# Patient Record
Sex: Male | Born: 1940
Health system: Southern US, Community
[De-identification: ages and names within clinical notes are randomized; demographics above are authoritative.]

## PROBLEM LIST (undated history)

## (undated) DIAGNOSIS — I10 Essential (primary) hypertension: Secondary | ICD-10-CM

## (undated) DIAGNOSIS — K635 Polyp of colon: Secondary | ICD-10-CM

## (undated) DIAGNOSIS — C61 Malignant neoplasm of prostate: Secondary | ICD-10-CM

## (undated) DIAGNOSIS — E785 Hyperlipidemia, unspecified: Secondary | ICD-10-CM

## (undated) DIAGNOSIS — K5790 Diverticulosis of intestine, part unspecified, without perforation or abscess without bleeding: Secondary | ICD-10-CM

## (undated) HISTORY — DX: Hyperlipidemia, unspecified: E78.5

## (undated) HISTORY — DX: Diverticulosis of intestine, part unspecified, without perforation or abscess without bleeding: K57.90

## (undated) HISTORY — DX: Essential (primary) hypertension: I10

## (undated) HISTORY — DX: Malignant neoplasm of prostate: C61

## (undated) HISTORY — PX: PROSTATECTOMY: SHX69

---

## 1999-05-11 ENCOUNTER — Inpatient Hospital Stay (HOSPITAL_COMMUNITY): Admission: RE | Admit: 1999-05-11 | Discharge: 1999-05-17 | Payer: Self-pay | Admitting: Psychiatry

## 1999-05-13 ENCOUNTER — Encounter (HOSPITAL_COMMUNITY): Payer: Self-pay | Admitting: Psychiatry

## 1999-05-20 ENCOUNTER — Encounter (HOSPITAL_COMMUNITY): Admission: RE | Admit: 1999-05-20 | Discharge: 1999-05-27 | Payer: Self-pay | Admitting: Psychiatry

## 2001-05-14 ENCOUNTER — Other Ambulatory Visit: Admission: RE | Admit: 2001-05-14 | Discharge: 2001-05-14 | Payer: Self-pay | Admitting: Urology

## 2002-09-06 ENCOUNTER — Ambulatory Visit (HOSPITAL_COMMUNITY): Admission: RE | Admit: 2002-09-06 | Discharge: 2002-09-06 | Payer: Self-pay | Admitting: Internal Medicine

## 2002-09-06 ENCOUNTER — Encounter: Payer: Self-pay | Admitting: Internal Medicine

## 2003-02-25 ENCOUNTER — Other Ambulatory Visit: Admission: RE | Admit: 2003-02-25 | Discharge: 2003-02-25 | Payer: Self-pay | Admitting: Urology

## 2003-12-02 ENCOUNTER — Ambulatory Visit (HOSPITAL_COMMUNITY): Admission: RE | Admit: 2003-12-02 | Discharge: 2003-12-02 | Payer: Self-pay | Admitting: Internal Medicine

## 2003-12-02 HISTORY — PX: COLONOSCOPY: SHX174

## 2007-03-08 ENCOUNTER — Encounter (INDEPENDENT_AMBULATORY_CARE_PROVIDER_SITE_OTHER): Payer: Self-pay | Admitting: *Deleted

## 2007-03-08 ENCOUNTER — Ambulatory Visit (HOSPITAL_COMMUNITY): Admission: RE | Admit: 2007-03-08 | Discharge: 2007-03-08 | Payer: Self-pay | Admitting: Internal Medicine

## 2007-03-08 ENCOUNTER — Ambulatory Visit: Payer: Self-pay | Admitting: Internal Medicine

## 2007-03-08 HISTORY — PX: COLONOSCOPY: SHX174

## 2008-12-05 DIAGNOSIS — C61 Malignant neoplasm of prostate: Secondary | ICD-10-CM

## 2008-12-05 HISTORY — DX: Malignant neoplasm of prostate: C61

## 2010-08-24 ENCOUNTER — Encounter (HOSPITAL_COMMUNITY): Admission: RE | Admit: 2010-08-24 | Discharge: 2010-08-24 | Payer: Self-pay | Admitting: Urology

## 2011-03-13 ENCOUNTER — Emergency Department (HOSPITAL_COMMUNITY)
Admission: EM | Admit: 2011-03-13 | Discharge: 2011-03-14 | Disposition: A | Discharge status: Home / Self Care | Payer: Medicare Other | Attending: Emergency Medicine | Admitting: Emergency Medicine

## 2011-03-13 DIAGNOSIS — Z79899 Other long term (current) drug therapy: Secondary | ICD-10-CM | POA: Insufficient documentation

## 2011-03-13 DIAGNOSIS — IMO0002 Reserved for concepts with insufficient information to code with codable children: Secondary | ICD-10-CM | POA: Insufficient documentation

## 2011-03-13 DIAGNOSIS — R4182 Altered mental status, unspecified: Secondary | ICD-10-CM | POA: Insufficient documentation

## 2011-03-13 DIAGNOSIS — I10 Essential (primary) hypertension: Secondary | ICD-10-CM | POA: Insufficient documentation

## 2011-03-13 DIAGNOSIS — F29 Unspecified psychosis not due to a substance or known physiological condition: Secondary | ICD-10-CM | POA: Insufficient documentation

## 2011-03-13 DIAGNOSIS — R000 Tachycardia, unspecified: Secondary | ICD-10-CM | POA: Insufficient documentation

## 2011-03-13 DIAGNOSIS — Z7982 Long term (current) use of aspirin: Secondary | ICD-10-CM | POA: Insufficient documentation

## 2011-03-13 LAB — DIFFERENTIAL
Basophils Absolute: 0 10*3/uL (ref 0.0–0.1)
Basophils Relative: 0 % (ref 0–1)
Eosinophils Absolute: 0 10*3/uL (ref 0.0–0.7)
Eosinophils Relative: 0 % (ref 0–5)
Lymphocytes Relative: 8 % — ABNORMAL LOW (ref 12–46)
Lymphs Abs: 0.7 10*3/uL (ref 0.7–4.0)
Monocytes Absolute: 0.5 10*3/uL (ref 0.1–1.0)
Monocytes Relative: 6 % (ref 3–12)
Neutro Abs: 6.9 10*3/uL (ref 1.7–7.7)
Neutrophils Relative %: 85 % — ABNORMAL HIGH (ref 43–77)

## 2011-03-13 LAB — BASIC METABOLIC PANEL
CO2: 18 mEq/L — ABNORMAL LOW (ref 19–32)
Calcium: 8.7 mg/dL (ref 8.4–10.5)
Chloride: 99 mEq/L (ref 96–112)
GFR calc Af Amer: >60 mL/min (ref >60)
Glucose, Bld: 186 mg/dL — ABNORMAL HIGH (ref 70–99)
Sodium: 135 mEq/L (ref 135–145)

## 2011-03-13 LAB — CBC
HCT: 42.6 % (ref 39.0–52.0)
Hemoglobin: 14.5 g/dL (ref 13.0–17.0)
MCH: 31 pg (ref 26.0–34.0)
MCHC: 34 g/dL (ref 30.0–36.0)
MCV: 91.2 fL (ref 78.0–100.0)
Platelets: 251 10*3/uL (ref 150–400)
RBC: 4.67 MIL/uL (ref 4.22–5.81)
RDW: 13.1 % (ref 11.5–15.5)
WBC: 8.1 10*3/uL (ref 4.0–10.5)

## 2011-03-13 LAB — RAPID URINE DRUG SCREEN, HOSP PERFORMED
Amphetamines: NOT DETECTED
Barbiturates: NOT DETECTED
Benzodiazepines: NOT DETECTED
Cocaine: NOT DETECTED
Opiates: NOT DETECTED
Tetrahydrocannabinol: NOT DETECTED

## 2011-03-14 ENCOUNTER — Emergency Department (HOSPITAL_COMMUNITY): Payer: Medicare Other

## 2011-03-16 ENCOUNTER — Inpatient Hospital Stay (HOSPITAL_COMMUNITY)
Admission: EM | Admit: 2011-03-16 | Discharge: 2011-03-19 | DRG: 885 | Disposition: A | Discharge status: Other Acute Inpatient Hospital | Payer: Medicare Other | Attending: Internal Medicine | Admitting: Internal Medicine

## 2011-03-16 ENCOUNTER — Emergency Department (HOSPITAL_COMMUNITY): Payer: Medicare Other

## 2011-03-16 DIAGNOSIS — F29 Unspecified psychosis not due to a substance or known physiological condition: Principal | ICD-10-CM | POA: Diagnosis present

## 2011-03-16 DIAGNOSIS — R7301 Impaired fasting glucose: Secondary | ICD-10-CM | POA: Diagnosis present

## 2011-03-16 DIAGNOSIS — E785 Hyperlipidemia, unspecified: Secondary | ICD-10-CM | POA: Diagnosis present

## 2011-03-16 DIAGNOSIS — F1010 Alcohol abuse, uncomplicated: Secondary | ICD-10-CM | POA: Diagnosis present

## 2011-03-16 LAB — URINALYSIS, ROUTINE W REFLEX MICROSCOPIC
Bilirubin Urine: NEGATIVE
Glucose, UA: NEGATIVE mg/dL
Hgb urine dipstick: NEGATIVE
Protein, ur: NEGATIVE mg/dL

## 2011-03-16 LAB — DIFFERENTIAL
Eosinophils Relative: 1 % (ref 0–5)
Lymphocytes Relative: 22 % (ref 12–46)
Lymphs Abs: 1 10*3/uL (ref 0.7–4.0)
Monocytes Absolute: 0.5 10*3/uL (ref 0.1–1.0)
Monocytes Relative: 10 % (ref 3–12)
Neutro Abs: 3.2 10*3/uL (ref 1.7–7.7)

## 2011-03-16 LAB — RAPID URINE DRUG SCREEN, HOSP PERFORMED
Amphetamines: NOT DETECTED
Barbiturates: NOT DETECTED
Benzodiazepines: NOT DETECTED
Tetrahydrocannabinol: NOT DETECTED

## 2011-03-16 LAB — BASIC METABOLIC PANEL
BUN: 7 mg/dL (ref 6–23)
CO2: 26 mEq/L (ref 19–32)
Calcium: 9.1 mg/dL (ref 8.4–10.5)
Chloride: 102 mEq/L (ref 96–112)
Creatinine, Ser: 0.94 mg/dL (ref 0.4–1.5)
GFR calc Af Amer: >60 mL/min (ref >60)
Glucose, Bld: 115 mg/dL — ABNORMAL HIGH (ref 70–99)

## 2011-03-16 LAB — CBC
HCT: 41.9 % (ref 39.0–52.0)
Hemoglobin: 14.2 g/dL (ref 13.0–17.0)
MCH: 31.1 pg (ref 26.0–34.0)
MCHC: 33.9 g/dL (ref 30.0–36.0)
MCV: 91.9 fL (ref 78.0–100.0)
RDW: 12.8 % (ref 11.5–15.5)

## 2011-03-16 LAB — TSH: TSH: 1.186 u[IU]/mL (ref 0.350–4.500)

## 2011-03-18 ENCOUNTER — Inpatient Hospital Stay (HOSPITAL_COMMUNITY): Payer: Medicare Other

## 2011-03-18 LAB — HEPATIC FUNCTION PANEL
ALT: 20 U/L (ref 0–53)
AST: 21 U/L (ref 0–37)
Bilirubin, Direct: 0.1 mg/dL (ref 0.0–0.3)
Indirect Bilirubin: 0.5 mg/dL (ref 0.3–0.9)
Total Bilirubin: 0.6 mg/dL (ref 0.3–1.2)

## 2011-03-19 ENCOUNTER — Inpatient Hospital Stay (HOSPITAL_COMMUNITY)
Admission: RE | Admit: 2011-03-19 | Discharge: 2011-03-22 | DRG: 885 | Disposition: A | Discharge status: Home / Self Care | Payer: 59 | Source: Ambulatory Visit | Attending: Psychiatry | Admitting: Psychiatry

## 2011-03-19 DIAGNOSIS — F4321 Adjustment disorder with depressed mood: Secondary | ICD-10-CM

## 2011-03-19 DIAGNOSIS — Z7982 Long term (current) use of aspirin: Secondary | ICD-10-CM

## 2011-03-19 DIAGNOSIS — E781 Pure hyperglyceridemia: Secondary | ICD-10-CM

## 2011-03-19 DIAGNOSIS — F0390 Unspecified dementia without behavioral disturbance: Secondary | ICD-10-CM

## 2011-03-19 DIAGNOSIS — E876 Hypokalemia: Secondary | ICD-10-CM

## 2011-03-19 DIAGNOSIS — F1020 Alcohol dependence, uncomplicated: Secondary | ICD-10-CM

## 2011-03-19 DIAGNOSIS — F29 Unspecified psychosis not due to a substance or known physiological condition: Principal | ICD-10-CM

## 2011-03-19 DIAGNOSIS — R7309 Other abnormal glucose: Secondary | ICD-10-CM

## 2011-03-19 LAB — BASIC METABOLIC PANEL
BUN: 9 mg/dL (ref 6–23)
Calcium: 9.2 mg/dL (ref 8.4–10.5)
GFR calc non Af Amer: >60 mL/min (ref >60)
Glucose, Bld: 136 mg/dL — ABNORMAL HIGH (ref 70–99)

## 2011-03-20 DIAGNOSIS — F1020 Alcohol dependence, uncomplicated: Secondary | ICD-10-CM

## 2011-03-20 LAB — GLUCOSE, CAPILLARY: Glucose-Capillary: 105 mg/dL — ABNORMAL HIGH (ref 70–99)

## 2011-03-21 LAB — HEMOGLOBIN A1C
Hgb A1c MFr Bld: 5.4 % (ref <5.7)
Mean Plasma Glucose: 108 mg/dL (ref <117)

## 2011-03-21 LAB — GLUCOSE, CAPILLARY: Glucose-Capillary: 99 mg/dL (ref 70–99)

## 2011-03-21 NOTE — Group Therapy Note (Addendum)
  NAMEDENY, CHEVEZ              ACCOUNT NO.:  [id number]  MEDICAL RECORD NO.:  [id number]         PATIENT TYPE:  INP  LOCATION:                                FACILITY:  [facility]  PHYSICIAN:  Kingsley Callander. Ouida Sills, MD       DATE OF BIRTH:  12-21-1940  DATE OF PROCEDURE: DATE OF DISCHARGE:                                PROGRESS NOTE   HISTORY OF PRESENT ILLNESS:  Mr. Luz Brazen was admitted yesterday after exhibiting psychotic behavior again.  He had been in the hospital emergency room a few days ago and he has been evaluated but evidently discharged home instead of being sent to a psychiatric facility.  He has a history of alcohol abuse and had 6-8 beers during the night prior to being admitted midday yesterday.  His alcohol level yesterday was less than 5 however.  He underwent a CT scan of the brain which revealed which revealed no acute abnormalities yesterday afternoon and later underwent an MRI of the brain which revealed no acute abnormalities.  He did have nonspecific white matter changes.  He had also undergone a CT scan of the brain back on the March 14, 2011, which revealed no acute abnormalities.  He has exhibited paranoid spells.  He has heard voices. He has been afraid that the scalp was falling.  He has been afraid that the world has been attacked and that the [place] is the only placed left.  He has had sleep disturbance with staying up at night.  He is alert and is fully oriented this morning.  He shows no tremulousness or signs of alcohol withdrawal.  PHYSICAL EXAMINATION:  VITAL SIGNS:  Temperature 97.3, pulse 72, respirations 18, blood pressure 111/75, oxygen saturation 97% on room air. HEENT:  Unremarkable. LUNGS:  Clear. HEART:  Regular with no murmurs. ABDOMEN:  Nontender with no hepatosplenomegaly. EXTREMITIES:  No edema. NEURO:  No focal weakness.  No tremulousness or other acute abnormalities.  IMPRESSION/PLAN:  Psychosis.  Telepsychiatry consultation will  be obtained.  He is evidently been seen by the ACT team, but I see no documentation of that in the progress note and I am unsure of exactly what happened with their assessment during his recent emergency room stay.  He is euthyroid with a TSH of 1.18.  He has an ammonia level of 24.  His urine drug screen reveals no drugs.  His wife denies any drug use.  His urinalysis shows no sign of infection, although he did have 15 mg/dL of ketones.  He is mildly hypokalemic at 3.3 and mildly hyperglycemic at 115.  His blood counts reveal a hemoglobin of 14.2, white count 4.8, and platelets of 198,000.  Chest x-ray reveals minimal bronchitic changes and bibasilar atelectasis.  He will be started empirically on Risperdal 0.25 mg b.i.d.  He will be treated with an alcohol withdrawal protocol.     Kingsley Callander. Ouida Sills, MD     ROF/MEDQ  D:  03/17/2011  T:  03/17/2011  Job:  [id number]  Electronically Signed by Carylon Perches MD on 04/02/2011 11:09:11 AM

## 2011-03-21 NOTE — H&P (Signed)
NAMEPHI, AVANS              ACCOUNT NO.:  [id number]  MEDICAL RECORD NO.:  [id number]           PATIENT TYPE:  I  LOCATION:  A341                          FACILITY:  [facility]  PHYSICIAN:  Rosalita Carey L. Juanetta Gosling, M.D.DATE OF BIRTH:  May 06, 1941  DATE OF ADMISSION:  03/16/2011 DATE OF DISCHARGE:  LH                             HISTORY & PHYSICAL   REASON FOR ADMISSION:  Confusion.  HISTORY:  Mr. Balestrieri is a 70 year old who has been confused now for at least 4-5 days.  His wife says that he has had problems with confusion. He has been having some episodes of delusional behavior.  He has been agitated.  He has not been sleeping and he was seen here on the 8th. Apparently at that time, he was proclaiming himself to be God and that the devil was after him.  He had a psych and medical evaluation, was not felt to be in a situation that he needed commitment and was discharged. He has apparently been doing better, but as soon as he got home, he says he has been hearing voices, he has been confused, disoriented.  He is not sleeping and apparently he has put out areas in the home where only he can walk and other areas where only his wife can walk.  He is coherent now, but tangential.  His past medical history was otherwise positive for previous episode of psychosis that culminated in him being admitted to a psychiatric institution.  PAST MEDICAL HISTORY:  Positive for hypertension and hyperlipidemia.  He has had a prostate resection.  SOCIAL HISTORY:  He does not smoke, does not use any illicit drugs, does use alcohol, but his alcohol level was low when he came in.  His only medications are Niaspan and aspirin.  His family history is not positive for any sort of psychosis.  REVIEW OF SYSTEMS:  Except as mentioned negative.  PHYSICAL EXAMINATION:  GENERAL:  He is really not willing to answer those questions. HEENT:  His pupils are reactive.  Nose and throat are clear. NECK:  Supple  without masses. CHEST:  Fairly clear. HEART:  Regular. ABDOMEN:  Soft. EXTREMITIES:  Showed no edema. CENTRAL NERVOUS SYSTEM EXAMINATION:  Shows that he is somewhat agitated.  Laboratory work shows his white count is 4800, hemoglobin is 14.2, platelets 198, potassium is 3.3, glucose of 115, BUN is 7, creatinine 0.94.  Urinalysis is essentially negative.  He has some ketones in his urine suggestive that he has not been eating.  His alcohol level less than 5, it was also less than 5 on the 8th.  Drug screen on the 8th was normal.  Drug screen now is normal.  Assessment then he has clearly had some psychosis.  It was thought in the emergency room that this may be more of delirium and with his history of significant alcohol abuse, I am going to treat him as if he has delirium tremens, but I think it is probably mental illness based on his previous history.  I am going to put him on some Ativan now and put him on alcohol withdrawal protocol just to be  sure it is not clear whether he has been drinking or not.     Annaleigha Woo L. Juanetta Gosling, M.D.     ELH/MEDQ  D:  03/16/2011  T:  03/17/2011  Job:  [id number]  Electronically Signed by Kari Baars M.D. on 03/21/2011 08:26:58 AM

## 2011-03-25 NOTE — H&P (Signed)
NAMEGIULIAN, Howard Sawyer              ACCOUNT NO.:  [id number]  MEDICAL RECORD NO.:  [id number]           PATIENT TYPE:  I  LOCATION:  0304                          [facility]  [facility]  PHYSICIAN:  Anselm Jungling, MD  DATE OF BIRTH:  04-15-1941  DATE OF ADMISSION:  03/19/2011 DATE OF DISCHARGE:                      PSYCHIATRIC ADMISSION ASSESSMENT   He is a 70 year old married white male who was admitted for symptoms of psychosis from [facility].  The patient was brought in on 2 different occasions in the week prior to the emergency room where he was displaying psychotic symptoms and bizarre behavior, thinking that he was God and hearing voices.  His wife had reported that he had cordoned off several portions of the house, saying that he could not speak with her while he was in these areas and that he could not speak directly to her. The patient was treated and released when symptomatology decreased and his mentation returned to normal from the emergency room.  He presented again 48 hours later with the same symptoms and was treated with rehydration, Librium detox and potassium.  He was also given a small amount of Risperdal.  He presents today for further evaluation.  PAST PSYCHIATRIC HISTORY:  He has a history of psychiatric admission in the past where he has had some psychosis but the location is unclear. He also reports being detoxed at some point in the 82s and in 2000.  FAMILY HISTORY:  He is 1 of 4 children.  He was born in [place].  Both of his parents are deceased.  Father was a Forensic scientist.  He is #3 out of 4, and his older brother passed away.  He has no natural children and 2 stepchildren.  He finished high school and had some college.  She has a history of Financial planner in [employer] for 6 years and 1 year in the [employer].  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  PSYCHIATRIC FAMILY HISTORY:  None.  PAST MEDICAL HISTORY:  The patient has had prostate surgery in October 2011.  He has a history of hypertriglyceridemia and sees [facility]/support/administrative officer in [place].  He takes niacin and an aspirin daily.  He is a nonsmoker and reportedly drinks 6 to 8 beers daily and has done that for the past 30 years.  He has been sober for as long as a year in the past but today cannot tell me when he was sober last.  He also states that he may have been drinking more beer lately due to the grief over the recent loss of his brother.  HE HAS NO KNOWN DRUG ALLERGIES.  Vital signs are stable.  The patient was evaluated Shaune Pollack in the emergency room, and the exam was unremarkable.  PERTINENT LABORATORY:  CBC which was normal.  Drug screen which was negative.  Alcohol level on the 8th was less than 5.  Alcohol level on the 12th was less than 5.  Basic metabolic profile on the 8th showed decreased potassium  at 3.1, elevated glucose 186 and a GFR of 51. Repeat BMET again on the 11th showed a potassium of 3.3, glucose 115, ammonia was 24, TSH also 1.186.  Lipase was 50, SGOT 21, SGPT 20. Hepatitis panel is grossly negative.  MENTAL STATUS EXAM:  The patient is alert, oriented and cooperative.  He is well-oriented to day, date and location.  He is dressed neatly, hair is combed.  He was is cooperative, makes good eye contact.  His speech is clear, goal-directed and coherent.  His mood is calm.  He is cooperative, appears somewhat depressed.  He does tend to have a fixed gaze when he is thinking.  His affect is appropriate.  Thought process is linear today.  He denies auditory or visual hallucinations; however, endorses having what he thought was bad dreams several days ago but has not had any since he has been in the hospital.  Cognition is at least average.  Memory is appropriate for short and long-term, and his abstract thinking is well.  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 AXIS II:  Negative. AXIS III:  Hypertriglyceridemia. AXIS IV:  Grief. AXIS V:  Current 40.  PLAN:  We will continue Risperdal 0.5 b.i.d.  Continue to monitor for signs of alcohol withdrawal.  Careful watchful waiting.  Estimated length of stay 3-5 days.    ______________________________ Verne Spurr, PA   ______________________________ Anselm Jungling, MD    NM/MEDQ  D:  03/20/2011  T:  03/20/2011  Job:  [id number]  Electronically Signed by Verne Spurr  on 03/21/2011 04:06:32 PM Electronically Signed by Geralyn Flash MD on 03/25/2011 11:43:32 AM

## 2011-03-30 NOTE — Discharge Summary (Signed)
NAMETAIKI, Howard Sawyer              ACCOUNT NO.:  [id number]  MEDICAL RECORD NO.:  [id number]           PATIENT TYPE:  I  LOCATION:  0304                          [facility]  PHYSICIAN:  Eulogio Ditch, MD DATE OF BIRTH:  1941-01-29  DATE OF ADMISSION:  03/19/2011 DATE OF DISCHARGE:                              DISCHARGE SUMMARY   HISTORY OF PRESENT ILLNESS:  The patient is a 70 year old, married white male who was admitted for symptoms of psychosis from [facility].  The patient had been brought to [facility] where he had been displaying psychotic symptoms, bizarre behavior, hearing voices.  The patient was treated initially for alcohol delirium with detox and then transferred to [facility].  For past psychiatric history, family history, social history and medical history please see previously dictated admission note.  PERTINENT LABORATORY DATA:  The patient was minimally hypokalemic and treated with potassium during his stay.  He had consistently been hyperglycemic throughout his hospitalization, and hemoglobin A1c was checked with a final result of 5.4.  CBGs were discontinued.  Other pertinent laboratory data included a negative hepatitis panel and a normal basic metabolic profile with the exception of hyperglycemia. Normal hepatic functions were done on the 13th of April, which revealed a minimally low albumin at 3.4.  All else was stable.  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 Axis II: Deferred. Axis III: Increased triglycerides as well as increased glucose. Axis IV: Grief related to the recent loss of his brother with associated depression. Axis V: At discharge the patient has a Global Assessment of Functioning of 58.  COURSE OF THE HOSPITAL STAY:  Unremarkable.  The patient participated in all groups and responded well to intervention from the family regarding his use of alcohol  and a need for follow-up treatment afterwards.  The patient responded well to pet therapy as he is a dog lover.  The patient also discussed and psychoeducation done regarding the need for medications and the need for follow-up care after discharge from [facility] with private psychiatrist as well as outpatient supportive treatment for alcohol abuse.  MENTAL STATUS EXAM:  The patient is alert, oriented and cooperative.  He has positive mood and a bright affect.  He voices no suicidal or homicidal ideation.  Reports that his depression is better.  His cognition is good without any indication of auditory or visual hallucinations.  He has not those since starting on Risperdal, which he has responded to quite well without any side effects, any evidence of movement disorder, and says that he feels much better on his.  Thinking is linear and straightforward and affect is appropriate.  No suicidal or homicidal ideation.  Perception is intact.  He is in full touch with reality and no evidence of psychosis.  Memory is as improved significantly.  Language is clear, straightforward, normal rate and rhythm, goal-directed coherent.  Insight is good in relationship to his need for treatment and follow-up care in, and judgment is good in that he states he plans to follow up appropriately.  MEDICATIONS AT DISCHARGE:  Risperdal 0.5 mg 1 tablet b.i.d.  He will continue on enteric-coated aspirin, fish oil and Niaspan SR 500 one p.o. daily.  DISCHARGE FOLLOW UP:  He will follow up with his psychiatrist, Dr. Katherine Roan, in the outpatient clinic and the appointment is scheduled for May 10th at 11:00 a.m.  He is given the name, date, number and address to follow up.  He will also be advised to participate in local AA meetings and that information is given as well.  The patient stated he was thankful for his time here and feels much better and will be pleased to follow up as  planned.    ______________________________ Verne Spurr, PA   ______________________________ Eulogio Ditch, MD    NM/MEDQ  D:  03/22/2011  T:  03/22/2011  Job:  [id number]  Electronically Signed by Verne Spurr  on 03/28/2011 02:04:30 PM Electronically Signed by Eulogio Ditch  on 03/30/2011 05:03:49 PM

## 2011-04-02 NOTE — Discharge Summary (Signed)
  NAMESTEVE, YOUNGBERG              ACCOUNT NO.:  [id number]  MEDICAL RECORD NO.:  [id number]          PATIENT TYPE:  LOCATION:                                 FACILITY:  PHYSICIAN:  Kingsley Callander. Ouida Sills, MD            DATE OF BIRTH:  DATE OF ADMISSION: DATE OF DISCHARGE:  LH                         DISCHARGE SUMMARY-REFERRING   ADDENDUM: His other discharge medication is Risperdal 0.25 mg b.i.d., which was started on the March 17, 2011.     Kingsley Callander. Ouida Sills, MD     ROF/MEDQ  D:  03/18/2011  T:  03/18/2011  Job:  [id number]  Electronically Signed by Carylon Perches MD on 04/02/2011 11:09:05 AM

## 2011-04-02 NOTE — Group Therapy Note (Signed)
  NAMEKAYEN, GRABEL              ACCOUNT NO.:  [id number]  MEDICAL RECORD NO.:  [id number]          PATIENT TYPE:  LOCATION:                                 FACILITY:  PHYSICIAN:  Kingsley Callander. Ouida Sills, MD            DATE OF BIRTH:  DATE OF PROCEDURE: DATE OF DISCHARGE:                                PROGRESS NOTE   Mr. Luz Brazen continues to exhibit delusional thinking.  He had an ACT team consult yesterday and a tele site consult.  It appears with a mid level at the psychiatric facility who recommended Librium and outpatient day-mark referral.  It is clearly documented in the medical record by the ACT team that the patient is delusional, paranoid, and exhibiting psychotic features.  PHYSICAL EXAMINATION:  VITAL SIGNS:  The patient's vital signs are normal.  His temperature is 97.6, pulse 66, respirations 19, blood pressure 114/76, and oxygen saturation 96%. GENERAL:  He shows no signs of active alcohol withdrawal.  He is calm. HEENT:  Unremarkable. LUNGS:  Clear. HEART:  Regular with no murmurs. ABDOMEN:  Nontender with no hepatosplenomegaly. EXTREMITIES:  No edema. NEURO:  No focal weakness.  He is not tremulous.  IMPRESSION/PLAN: 1. Psychosis:  This does not appear to be related to alcohol     withdrawal.  I am unclear why the mid level would change from an     alcohol withdrawal protocol using Ativan to Librium.  This would     not seem to make much difference in my mind.  Another attempt will     be made to obtain a tele site consult with a psychiatrist at this     time.  He has been started on Risperdal. 2. History of alcohol abuse.  It is my opinion that this patient needs inpatient psychiatric care. His wife has continued to observe bizarre behavior which has been previously documented.     Kingsley Callander. Ouida Sills, MD     ROF/MEDQ  D:  03/18/2011  T:  03/18/2011  Job:  [id number]  Electronically Signed by Carylon Perches MD on 04/02/2011 11:09:08 AM

## 2011-04-02 NOTE — Discharge Summary (Signed)
  Howard Sawyer, Howard Sawyer              ACCOUNT NO.:  [id number]  MEDICAL RECORD NO.:  [id number]          PATIENT TYPE:  LOCATION:                                 FACILITY:  PHYSICIAN:  Kingsley Callander. Ouida Sills, MD            DATE OF BIRTH:  DATE OF ADMISSION:  03/16/2011 DATE OF DISCHARGE:  04/13/2012LH                         DISCHARGE SUMMARY-REFERRING   DISCHARGE DIAGNOSES: 1. Acute psychosis. 2. Chronic alcohol abuse. 3. Impaired fasting glucose. 4. Hyperlipidemia. 5. Prostate cancer. 6. History of colon polyps.  PROCEDURES:  CT scan of the brain MRI of the brain.  HOSPITAL COURSE:  This patient is a 70 year old male, who was admitted on Wednesday after exhibiting bizarre behavior at home.  He was suffering delusional thinking and complaining of hearing voices.  He had paranoid ideation.  He had been in the emergency room on March 15, 2011, and had been discharged.  He had been treated at that time with Geodon. He has had CT scan of his brain and MRI, which revealed no acute abnormality.  He had been evaluated by the ACT team.  Upon representation, he was again evaluated by the ACT team.  He was hospitalized and was treated with an alcohol detox regimen including Ativan, thiamine, multivitamins, and folate.  He underwent a Telepsychiatry consult and the recommendation was to change him to Librium.  He is felt to require inpatient evaluation and treatment.  This was not satisfactorily being obtained through the [facility] and other arrangements with [facility] are currently in process.  He is not felt to be suffering from mere alcohol withdrawal.  He is not shaky.  He is calm, but clearly has disorder thinking.  He keeps telling me that he thinks this business in the hospital is a "[place]  He had previously been complaining of the devil chasing him.  He had been worried that the [place] was the only country left unscathed by  worldwide destruction.  His wife had clearly  not been able to deal with him in his current state.  He is certainly not an appropriate level to be discharged for outpatient alcohol follow up as had been recommended by the [facility] staff earlier.  As stated, arrangements are being made with [place].  CURRENT MEDICATIONS: 1. Librium taper, currently at 25 mg q.8 h. 2. Thiamine 100 mg daily. 3. Multivitamin one daily. 4. Folic acid 1 mg daily. 5. Aspirin 81 mg daily.  His previous outpatient medications had only included niacin 1000 mg daily and 3 fish oil capsules daily with aspirin 81 mg daily.     Kingsley Callander. Ouida Sills, MD     ROF/MEDQ  D:  03/18/2011  T:  03/18/2011  Job:  [id number]  Electronically Signed by Carylon Perches MD on 04/02/2011 11:09:01 AM

## 2011-04-14 ENCOUNTER — Ambulatory Visit (INDEPENDENT_AMBULATORY_CARE_PROVIDER_SITE_OTHER): Payer: Medicare Other | Admitting: Psychiatry

## 2011-04-14 DIAGNOSIS — F3189 Other bipolar disorder: Secondary | ICD-10-CM

## 2011-04-15 NOTE — Consult Note (Signed)
Howard Sawyer, DELLAROCCO              ACCOUNT NO.:  [id number]  MEDICAL RECORD NO.:  [id number]           PATIENT TYPE:  LOCATION:                                 FACILITY:  PHYSICIAN:  Verne Spurr, PA      DATE OF BIRTH:  02/16/1941  DATE OF CONSULTATION: DATE OF DISCHARGE:                                CONSULTATION   REASON FOR CONSULTATION:  The patient was requested to have a psychiatric evaluation after his second visit to the [facility] Room for mental status changes.  This consult is requested by Dr. Kari Baars in [place], [place].  Mr. Lundberg and his wife were evaluated via telemedicine conference on January 16, 2011, at 4:15 p.m. in the afternoon.  HISTORY OF PRESENT ILLNESS:  The patient is a 70 year old married white male who again was admitted for the second time due to altered mental status.  The patient presented as confused, somewhat delusional and paranoid reporting that he had been hearing voices and sleeping, acting oddly he is not sleeping.  PAST PSYCHIATRIC HISTORY:  The patient has had a history of psychiatric admission in the past and frank psychosis for which he was admitted previously in 2009, but that was for alcohol detox in 2000 at an unknown facility.  FAMILY HISTORY:  The patient is one of four children.  He had an older brother who died 2 weeks ago and two sisters who live in another state. His parents are deceased.  He says he has a 44 year old and a 68 year- old.  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  FAMILY PSYCHIATRIC HISTORY:  Unavailable.  PAST MEDICAL HISTORY:  Significant and that he had prostate surgery in October 2011.  He denies any current health problems other than hypertriglyceridemia.  CURRENT MEDICATIONS:  Niaspan and a daily aspirin.  He is a nonsmoker, but drinks 6-8 beers daily and he has done that for the last 30 years.  He  reports having a DUI in the 43s and has been treated with AA in the past, but says he could not recall when he began drinking after that, his last detox.  His wife is extremely concerned because of his erratic behavior.  In the last several days, he reports feeling confused and easily fatigued after working out in the yard, brought to the hospital, where initial workup was evaluated.  He was treated and released upon rehydration and given potassium, which was minimally low at 3.0.  He returned to the emergency room 2 days later, telling the emergency room staff that he was god and admitted that he had been drinking 6-8 beers for some time, which has not been apparent previously.  On his admission blood alcohol level on Saturday, the level was less than 5 and on this admission also less than 5.  The patient reports that he has had seizures upon alcohol withdrawal in the past and says that he does not blackout, but he does go to sleep.  He denies any other substance abuse at  this time.  PHYSICAL EXAMINATION:  Unremarkable.  LABORATORY DATA:  Unremarkable.  Vital signs have remained stable.  He has been treated with Ativan and Risperdal 0.25 b.i.d.  Electrolyte solution thiamine and folic acid. His CIWA scores were monitored closely and provided by the nurse on evaluation and she reported an elevation of 10 yesterday noting that the patient was quite agitated and irritated and he admits as well that he did not feel well, had a headache, and had more symptomatology than previously alluded to with the ED staff or the clinical staff admitting to mild tremors, headache, nausea, irritability, light bothering his eyes, and easily agitated.  Labs did not reveal a hepatic panel.    Mental Status exam: Mr. Schulke is a 70 year old white male accompanied by his wife.  He is disheveled in appearance, wearing a hospital gown, sitting in a wheelchair.  He is alert, oriented to location, gait, and  year, but not to the day.  He is 2 days off.  He makes good eye contact with the camera.  His affect is somewhat blunted and constricted.  His mood is depressed and appropriate to the situation.  He acknowledges depression and crying over the loss of his brother 2 weeks ago.  He was fairly close to his brother, but he denies suicidality or homicidality.  He says he has had nightmares where he hears voices that he can explain and is very concerned about this.  His speech is clear and goal directed, coherent, normal rate and rhythm.  His mood again is depressed and affect is appropriate.  Thought process at this point is clearing and linear, content, difficult to rule out psychosis at this point. However, given the patient's state of withdrawal, it is difficult to tell if this is alcohol-induced psychosis or brief adjustment reaction secondary to grief considering his history of alcohol use.  He denies suicidality or homicidality.  Denies voices, audio, or visual hallucinations.  His judgment is poor and his insight is fair.  ASSESSMENT:  Axis I:  Alcohol withdrawal with a history of alcohol withdrawal seizures, grief and loss bereavement with multiple stressors, depressive disorder, likely recurrent, severe which will need to be reevaluated after he is through a detox. Axis II:  None. Axis III:  History of prostate cancer and history of alcohol abuse. Axis IV:  Grief and loss, Global Assessment of Functioning currently 40.  PLAN:  The patient should remain hospitalized until he is through alcohol withdrawal with a fever his for less than 4 for 24 hours.  I have asked for Librium taper over the next 4 days, hepatic profile, and lipase.  I have encouraged him to consult for alcohol rehab facility upon discharge as well as referral upon discharge from hospital to [facility] for further followup and management of his depressive disorder.          ______________________________ Verne Spurr, PA     NM/MEDQ  D:  03/17/2011  T:  03/17/2011  Job:  [id number]  Electronically Signed by Verne Spurr  on 03/21/2011 04:06:19 PM Electronically Signed by Eulogio Ditch  on 04/15/2011 09:36:48 AM

## 2011-04-15 NOTE — Group Therapy Note (Signed)
Howard Sawyer, Howard Sawyer              ACCOUNT NO.:  [id number]  MEDICAL RECORD NO.:  [id number]           PATIENT TYPE:  A  LOCATION:  [facility]                           FACILITY:  [facility]  PHYSICIAN:  Syed T. Arfeen, M.D.   DATE OF BIRTH:  January 06, 1941                                PROGRESS NOTE   HISTORY OF PRESENT ILLNESS: The patient is a 70 year old Caucasian unemployed married man who [facility] acute episode of psychosis and then needed stabilization.  The patient was initially seen in [facility] ER due to confusion and bizarre behavior by a doctor and then, it was recommended that the patient needed inpatient treatment. He was started on Risperdal 0.5 mg twice a day and felt better.  Today, the patient came with his wife and mentioned that he now recalled that prior to going to the ER, he had been bitten by a mosquito or bug when he was trying to clean the yard and removing the leaves, but he did not tell his doctor until his last visit to Dr. Ouida Sills on April 26th.  The patient recalled after he was bitten by that bug, he has noticed tunnel vision, confusion, some memory problems.  However, he does not recall the episodes in detail which happened in the ED when the patient was hallucinating, paranoid, having disorganized thinking and believed the world is going to end.  However, the patient feels now that he is doing much better.  His thinking is much clearer and organized.  However, his wife endorsed that the patient is not back to himself.  He continues to have poor attention, concentration and lack of organization and gets at times agitated and irritable when things are not done, but hallucinations, paranoia and disorganized thinking have resolved.  The patient also endorsed not sleeping very well, he believes, due to his prostate issue.  He cannot sleep very well on the bed and is sleeping on a couch as he has to go frequently to the  bathroom.  The patient reported no side effects of medication.  At this time, he does not exhibit any severe psychosis or paranoid or disorganized thinking.  The patient has been not drinking since he was released from the hospital. On his previous visit to the ER, it was suggested that he may be having withdrawal symptoms from the alcohol, as the patient was very confused but despite the treatment for the detox, he continued to exhibit the psychotic symptoms in the ER.  The patient and his wife told that there is no alcohol since the patient was released from the hospital.  PAST PSYCHIATRIC HISTORY: The patient remembers having one more episode of psychosis and depression in 2000 when he was in a great deal of stress due to financial crisis.  Credit card companies were calling, and he was in deep debt, and that put him in a lot of stress and required treatment, inpatient, at [facility] in 2000.  However, he does not remember the medication he was given at that time. The patient denies any history of previous suicidal attempt or  seeing any psychiatrist on a regular basis.  FAMILY HISTORY: Denies.  PSYCHOSOCIAL HISTORY: The patient lives with his wife.  They have been married for 26 years. They have two adopted children who live close, and the patient has been very involved and close to his adopted children.  Almost 2-3 months ago, his brother died due to a medical complication, but the patient was unable to attend the funeral as at that time, his wife was having surgeries.  The patient is one of four children.  He was born in [place].  His parents are deceased.  His father was a Forensic scientist.  He has worked in the past in the [employer] in [employer] for the past 6 years and then, he worked in a [employer] for 20 years.  This is his second marriage.  EDUCATION AND WORK HISTORY: The patient has some college education and is currently not working.  ALCOHOL AND SUBSTANCE  ABUSE HISTORY: The patient has a long history of drinking alcohol.  He usually is drinks 6-8 beers but since his release from the hospital, he has been not drinking.  The patient would like to go back to drinking but due to the psychiatric medication, he is concerned and does not want to drink at this time.  The patient denies any history of intravenous drug use or any other illegal substances.  MEDICAL HISTORY: The patient has a history of hypertriglyceridemia, prostate surgery in October and recently, a mosquito or tick bite.  He had negative CT and MRI in the ER last month.  Imaging studies show nonspecific white matter disease.  MENTAL STATUS EXAMINATION: The patient is a very friendly, pleasant man who is casually dressed and appears to be his stated age.  His speech is soft, pleasant and relevant.  His thought process still has some flight of ideas and loosening of associations but overall, he was able to stay in the conversation very well.  At times, he was noted to be circumstantial with poor attention and concentration, but there were no active auditory hallucinations, suicidal thoughts or homicidal thoughts.  He gets distracted at times.  There were no delusions or psychosis present at this time.  He is alert and oriented x3.  His immediate recall was okay. His serial 7's and registration were also okay.  There were no extrapyramidal side effects noted.  His insight, judgment and pulse control were okay.  DIAGNOSES: Axis I:  Psychosis not otherwise specified.  Psychosis due to general medical condition, possible tick bite or unknown etiology. Axis II:  Deferred. Axis III:  See medical history. Axis IV:  Mild to moderate. Axis V:  60-65.  PLAN: I talked with the patient and his wife in detail about his current illness and need for medication at this time.  I do believe that the patient should continue his antipsychotic medication for another 6-8 weeks.  We also talked  about gradually tapering off and eventually stopping the medication in the future.  At this time, the patient does not have any side effects and is comfortable with taking this medication.  We also talked about interaction of alcohol and psychotropic medication which the patient is aware about.  I recommended to call us if he has any questions or worsening of the symptoms. Otherwise, I will see him in 6 weeks.     Syed T. Lolly Mustache, M.D.     STA/MEDQ  D:  04/14/2011  T:  04/14/2011  Job:  [id number]  Electronically  Signed by Kathryne Sharper M.D. on 04/15/2011 09:16:09 AM

## 2011-04-22 NOTE — Op Note (Signed)
Howard Sawyer, Howard Sawyer              ACCOUNT NO.:  [id number]   MEDICAL RECORD NO.:  [id number]          PATIENT TYPE:  AMB   LOCATION:  DAY                           FACILITY:  [facility]   PHYSICIAN:  R. Roetta Sessions, M.D. DATE OF BIRTH:  05/06/1941   DATE OF PROCEDURE:  03/08/2007  DATE OF DISCHARGE:                               OPERATIVE REPORT   PROCEDURE PERFORMED:  Colonoscopy with snare polypectomy.   INDICATIONS FOR PROCEDURE:  The patient is 69 year old Caucasian male  with a history tubulovillous adenoma removed from the splenic flexure 3  years ago.  He has no lower GI tract symptoms now and he comes for  surveillance.  This approach has discussed with the patient at length.  Potential risks, benefits and alternatives have been reviewed, questions  answered.  He is agreeable.  Please see documentation in the medical  record.   PROCEDURE NOTE:  Oxygen saturation, blood pressure, pulse and  respirations were monitored throughout the entire procedure.   CONSCIOUS SEDATION:  IV Versed and Demerol in incremental doses.   INSTRUMENT:  Pentax video chip system.   FINDINGS:  Digital rectal exam revealed no abnormalities.   ENDOSCOPIC FINDINGS:  The prep was excellent.  Examination of the  colonic mucosa was then undertaken from the rectosigmoid junction where  the scope was advanced through the left transverse, right colon and up  to orifice, ileocecal valve and cecum.  These structures well seen and  photographed from the record.  From this level, the scope was slowly  withdrawn.  All previously mentioned mucosal surfaces were again seen.  The patient had extensive left-sided diverticula.  However, the  remainder of colonic mucosa appeared normal.  Scope was pulled down to  the rectum.  A thorough examination of the rectal mucosa including  retroflex view of the anal verge was undertaken.  The patient at 5 mm  polyp in the rectum at 15 cm with central area of ulceration.   Please  see photos.  It was totally resected with one pass of the hot snare  cautery unit and recovered.  The patient tolerated the procedure well,  was reacted in endoscopy.   IMPRESSIONS:  1. Solitary rectal polyp status post hot snare removal, otherwise      normal rectum.  2. Left-sided diverticula.  The remainder of the colonic mucosa      appeared normal.   RECOMMENDATIONS:  1. Diverticulosis literature provided Mr. Jeziorski.  2. No aspirin or arthritis medications for 10 days.  3. Follow-up on path.  4. Further recommendations to follow.      Jonathon Bellows, M.D.  Electronically Signed     RMR/MEDQ  D:  03/08/2007  T:  03/08/2007  Job:  [id number]   cc:   Kingsley Callander. Ouida Sills, MD  Fax: [phone]

## 2011-04-22 NOTE — Op Note (Signed)
Howard Sawyer, Howard Sawyer                        ACCOUNT NO.:  [id number]   MEDICAL RECORD NO.:  [id number]                   PATIENT TYPE:  AMB   LOCATION:  DAY                                  FACILITY:  [facility]   PHYSICIAN:  R. Roetta Sessions, M.D.              DATE OF BIRTH:  1941/10/11   DATE OF PROCEDURE:  12/02/2003  DATE OF DISCHARGE:                                 OPERATIVE REPORT   PROCEDURE:  Colonoscopy with snare polypectomy followed by injection  therapy.   INDICATIONS FOR PROCEDURE:  The patient is a 70 year old gentleman who sent  over at the courtesy of Dr. Ouida Sills for colorectal cancer screening.  He has  had multiple sigmoidoscopies previously demonstrating no significant  abnormalities.  He has no GI symptoms, there is no family history of colon  cancer.  He has never had his entire lower GI tract imaged per patient  report. Colonoscopy is now being done as a standard screening maneuver. This  approach has been discussed with the patient at length at the bedside. The  potential risks and benefits have been reviewed, questions answered.  Please  see my handwritten H&P for more information.   MONITORING:  The patient was placed in the left lateral decubitus position.  O2 saturation, blood pressure, pulse and respirations were monitored  throughout the entire procedure.   CONSCIOUS SEDATION:  Versed 3 mg IV, Demerol 75 mg IV in divided doses.   INSTRUMENT:  Olympus video chip adult colonoscope.   FINDINGS:  Digital rectal exam revealed no abnormalities.   ENDOSCOPIC FINDINGS:  The prep was adequate.   RECTUM:  Examination of the rectal mucosa including retroflexed view of the  anal verge revealed no abnormalities.   COLON:  The colonic mucosa was surveyed from the rectosigmoid junction  through the left transverse right colon to the area of the appendiceal  orifice, ileocecal valve and cecum. These structures were seen and  photographed for the record. From this  level, the scope was slowly  withdrawn.  All previously mentioned mucosal surfaces were once again seen.  The following abnormalities were noted:  1. He had extensive left sided diverticulum.  2. In the mid descending colon, he had a 0.7 cm pedunculated polyp. At the     splenic flexure, he had a 1.5 cm pedunculated angry appearing polyp on a     long stalk (see photos).  The remainder of the colonic mucosa appeared     normal. The 0.7 cm polyp mid descending colon was resected with snare     cautery and recovered through the scope.  The larger polyp was also     resected with one pass with the snare immediately and there was noted     some post polypectomy bleeding which was treated as follows. The base of     the polypectomy site was injected with 2 mL of 1:10,000 epinephrine. This  slowed the bleeding considerably.  The polypectomy site was additionally     sealed thermally with a couple of applications with the tip of the snare     cautery unit. There was excellent hemostasis and the area was observed     for approximately 10 minutes prior to coming out.   Both of the polyps resected today were recovered.  The patient tolerated the  procedure well and was reacted in endoscopy.   IMPRESSION:  1. Normal rectum.  2. Left sided diverticula.  3. Pedunculated polyp left colon resected as described above. Some post     polypectomy bleeding at the site where the larger polyp was located,     treated ith epinephrine injection therapy and thermal sealing. The     remainder of the colonic mucosa appeared normal.   RECOMMENDATIONS:  1. The patient was admonished not to take any aspirin, NSAIDs, Advil, etc.     for the next 10 days.  2. Diverticulosis literature provided to Mr. Cubit.  3. Further recommendations to follow. If he develops any rectal bleeding     post procedure, he is to let me know.      ___________________________________________                                             Jonathon Bellows, M.D.   RMR/MEDQ  D:  12/02/2003  T:  12/02/2003  Job:  [id number]   cc:   Kingsley Callander. Ouida Sills, M.D.  [address]  [place]  [place] [address]  Fax: [phone]

## 2011-05-17 ENCOUNTER — Encounter (HOSPITAL_COMMUNITY): Payer: Medicare Other | Admitting: Psychiatry

## 2012-02-09 ENCOUNTER — Encounter: Payer: Self-pay | Admitting: Internal Medicine

## 2012-02-10 ENCOUNTER — Telehealth: Payer: Self-pay | Admitting: Gastroenterology

## 2012-02-10 ENCOUNTER — Encounter: Payer: Self-pay | Admitting: Gastroenterology

## 2012-02-10 ENCOUNTER — Ambulatory Visit (INDEPENDENT_AMBULATORY_CARE_PROVIDER_SITE_OTHER): Payer: Medicare Other | Admitting: Urgent Care

## 2012-02-10 ENCOUNTER — Encounter: Payer: Self-pay | Admitting: Urgent Care

## 2012-02-10 VITALS — BP 140/80 | HR 62 | Temp 98.5°F | Ht 67.5 in | Wt 204.2 lb

## 2012-02-10 DIAGNOSIS — D126 Benign neoplasm of colon, unspecified: Secondary | ICD-10-CM

## 2012-02-10 MED ORDER — PEG-KCL-NACL-NASULF-NA ASC-C 100 G PO SOLR: 1.0000 | Freq: Once | ORAL | Status: DC

## 2012-02-10 NOTE — Patient Instructions (Signed)
You need a colonoscopy with Dr. Jena Gauss

## 2012-02-10 NOTE — Telephone Encounter (Signed)
Open in era

## 2012-02-10 NOTE — Progress Notes (Signed)
Faxed to PCP

## 2012-02-10 NOTE — Progress Notes (Signed)
Primary Care Physician:  FAGAN,ROY, MD, MD Primary Gastroenterologist:  Dr. Rourk  Chief Complaint  Patient presents with  . Colonoscopy    History colon polyp   HPI:  Howard Sawyer is a 70 y.o. male here to set up surveillance colonoscopy. He has history of adenomatous colon polyp. See last colonoscopy findings below.   Denies any upper GI symptoms including heartburn, indigestion, nausea, vomiting, dysphagia, odynophagia or anorexia.   Denies any lower GI symptoms including constipation, diarrhea, rectal bleeding, melena or weight loss.  Past Medical History  Diagnosis Date  . Hypertension   . Hyperlipidemia   . H/O transurethral resection of prostate   . Prostate ca 2010    Past Surgical History  Procedure Date  . Colonoscopy 03/08/2007    Serrated adenoma rectal polyp   . Colonoscopy  12/02/2003     Left sided diverticula/ Pedunculated polyp left colon resected as described above    Current Outpatient Prescriptions  Medication Sig Dispense Refill  . aspirin 81 MG tablet Take 81 mg by mouth daily.      . fish oil-omega-3 fatty acids 1000 MG capsule Take 2 g by mouth daily.      . niacin (NIASPAN) 1000 MG CR tablet Take 1,000 mg by mouth at bedtime.      . peg 3350 powder (MOVIPREP) 100 G SOLR Take 1 kit (100 g total) by mouth once. As directed Please purchase 1 Fleets enema to use with the prep  1 kit  0    Allergies as of 02/10/2012  . (No Known Allergies)   Family history: There is no known family history of colorectal carcinoma , liver disease, or inflammatory bowel disease.  History   Social History  . Marital Status: Married    Spouse Name: N/A    Number of Children: 0  . Years of Education: N/A   Occupational History  . retired; [employer]    Social History Main Topics  . Smoking status: Former Smoker -- 0.5 packs/day for 40 years    Types: Cigarettes    Quit date: 02/04/2012  . Smokeless tobacco: Current User    Types: Chew   Comment: quit about  5-6 years  . Alcohol Use: Yes     one case in a week  . Drug Use: No  . Sexually Active: Not on file  Review of Systems: Gen: Denies any fever, chills, sweats, anorexia, fatigue, weakness, malaise, weight loss, and sleep disorder CV: Denies chest pain, angina, palpitations, syncope, orthopnea, PND, peripheral edema, and claudication. Resp: Denies dyspnea at rest, dyspnea with exercise, cough, sputum, wheezing, coughing up blood, and pleurisy. GI: Denies vomiting blood, jaundice, and fecal incontinence.   Denies dysphagia or odynophagia. GU : Denies urinary burning, blood in urine, urinary frequency, urinary hesitancy, nocturnal urination, and urinary incontinence. MS: Denies joint pain, limitation of movement, and swelling, stiffness, low back pain, extremity pain. Denies muscle weakness, cramps, atrophy.  Derm: Denies rash, itching, dry skin, hives, moles, warts, or unhealing ulcers.  Psych: Denies depression, anxiety, memory loss, suicidal ideation, hallucinations, paranoia, and confusion. Heme: Denies bruising, bleeding, and enlarged lymph nodes. Neuro:  Denies any headaches, dizziness, paresthesias. Endo:  Denies any problems with DM, thyroid, adrenal function.  Physical Exam: BP 140/80  Pulse 62  Temp(Src) 98.5 F (36.9 C) (Temporal)  Ht 5' 7.5" (1.715 m)  Wt 204 lb 3.2 oz (92.625 kg)  BMI 31.51 kg/m2 General:   Alert,  Well-developed, well-nourished, pleasant and cooperative in NAD Head:    Normocephalic and atraumatic. Eyes:  Sclera clear, no icterus.   Conjunctiva pink. Ears:  Normal auditory acuity. Nose:  No deformity, discharge, or lesions. Mouth:  No deformity or lesions,oropharynx pink & moist. Neck:  Supple; no masses or thyromegaly. Lungs:  Clear throughout to auscultation.   No wheezes, crackles, or rhonchi. No acute distress. Heart:  Regular rate and rhythm; no murmurs, clicks, rubs,  or gallops. Abdomen:  Normal bowel sounds.  No bruits.  Soft, non-tender and  non-distended without masses, hepatosplenomegaly or hernias noted.  No guarding or rebound tenderness.   Rectal:  Deferred. Msk:  Symmetrical without gross deformities. Normal posture. Pulses:  Normal pulses noted. Extremities:  No clubbing or edema. Neurologic:  Alert and  oriented x4;  grossly normal neurologically. Skin:  Intact without significant lesions or rashes. Lymph Nodes:  No significant cervical adenopathy. Psych:  Alert and cooperative. Normal mood and affect.   

## 2012-02-10 NOTE — Assessment & Plan Note (Addendum)
Howard Sawyer is a pleasant 71 y.o. male with history of colonic adenomatous polyps in 2004 and 2008. He is due for surveillance colonoscopy. Denies any GI symptoms at this time.  I have discussed risks & benefits which include, but are not limited to, bleeding, infection, perforation & drug reaction.  The patient agrees with this plan & written consent will be obtained.    Urged tobacco cessation

## 2012-02-15 ENCOUNTER — Telehealth: Payer: Self-pay | Admitting: Gastroenterology

## 2012-02-15 ENCOUNTER — Encounter (HOSPITAL_COMMUNITY): Payer: Self-pay | Admitting: Pharmacy Technician

## 2012-02-15 MED ORDER — SODIUM CHLORIDE 0.45 % IV SOLN
Freq: Once | INTRAVENOUS | Status: AC
  Administered 2012-02-16: 08:00:00 via INTRAVENOUS

## 2012-02-15 NOTE — Telephone Encounter (Signed)
Pt aware of new arrival & procedure time- he will check in at [facility] @ 7:15

## 2012-02-16 ENCOUNTER — Encounter (HOSPITAL_COMMUNITY): Payer: Self-pay | Admitting: *Deleted

## 2012-02-16 ENCOUNTER — Ambulatory Visit (HOSPITAL_COMMUNITY)
Admission: RE | Admit: 2012-02-16 | Discharge: 2012-02-16 | Disposition: A | Discharge status: Home Health | Payer: Medicare Other | Source: Ambulatory Visit | Attending: Internal Medicine | Admitting: Internal Medicine

## 2012-02-16 ENCOUNTER — Encounter (HOSPITAL_COMMUNITY)
Admission: RE | Disposition: A | Discharge status: Home Health | Payer: Self-pay | Source: Ambulatory Visit | Attending: Internal Medicine

## 2012-02-16 DIAGNOSIS — Z860100 Personal history of colon polyps, unspecified: Secondary | ICD-10-CM | POA: Insufficient documentation

## 2012-02-16 DIAGNOSIS — K620 Anal polyp: Secondary | ICD-10-CM

## 2012-02-16 DIAGNOSIS — Z7982 Long term (current) use of aspirin: Secondary | ICD-10-CM | POA: Insufficient documentation

## 2012-02-16 DIAGNOSIS — D126 Benign neoplasm of colon, unspecified: Secondary | ICD-10-CM

## 2012-02-16 DIAGNOSIS — K5730 Diverticulosis of large intestine without perforation or abscess without bleeding: Secondary | ICD-10-CM | POA: Insufficient documentation

## 2012-02-16 DIAGNOSIS — K621 Rectal polyp: Secondary | ICD-10-CM

## 2012-02-16 DIAGNOSIS — I10 Essential (primary) hypertension: Secondary | ICD-10-CM | POA: Insufficient documentation

## 2012-02-16 DIAGNOSIS — Z79899 Other long term (current) drug therapy: Secondary | ICD-10-CM | POA: Insufficient documentation

## 2012-02-16 DIAGNOSIS — D128 Benign neoplasm of rectum: Secondary | ICD-10-CM | POA: Insufficient documentation

## 2012-02-16 DIAGNOSIS — Z1211 Encounter for screening for malignant neoplasm of colon: Secondary | ICD-10-CM

## 2012-02-16 DIAGNOSIS — E785 Hyperlipidemia, unspecified: Secondary | ICD-10-CM | POA: Insufficient documentation

## 2012-02-16 HISTORY — PX: COLONOSCOPY: SHX5424

## 2012-02-16 HISTORY — DX: Polyp of colon: K63.5

## 2012-02-16 SURGERY — COLONOSCOPY
Anesthesia: Moderate Sedation

## 2012-02-16 MED ORDER — MEPERIDINE HCL 100 MG/ML IJ SOLN
INTRAMUSCULAR | Status: DC | PRN
  Administered 2012-02-16: 50 mg via INTRAVENOUS
  Administered 2012-02-16: 25 mg via INTRAVENOUS

## 2012-02-16 MED ORDER — MIDAZOLAM HCL 5 MG/5ML IJ SOLN
INTRAMUSCULAR | Status: DC | PRN
  Administered 2012-02-16 (×2): 2 mg via INTRAVENOUS

## 2012-02-16 MED ORDER — MEPERIDINE HCL 100 MG/ML IJ SOLN
INTRAMUSCULAR | Status: AC
  Filled 2012-02-16: qty 1

## 2012-02-16 MED ORDER — MIDAZOLAM HCL 5 MG/5ML IJ SOLN
INTRAMUSCULAR | Status: AC
  Filled 2012-02-16: qty 10

## 2012-02-16 MED ORDER — STERILE WATER FOR IRRIGATION IR SOLN
Status: DC | PRN
  Administered 2012-02-16: 08:00:00

## 2012-02-16 NOTE — H&P (View-Only) (Signed)
Primary Care Physician:  Carylon Perches, MD, MD Primary Gastroenterologist:  Dr. Jena Gauss  Chief Complaint  Patient presents with  . Colonoscopy    History colon polyp   HPI:  Howard Sawyer is a 71 y.o. male here to set up surveillance colonoscopy. He has history of adenomatous colon polyp. See last colonoscopy findings below.   Denies any upper GI symptoms including heartburn, indigestion, nausea, vomiting, dysphagia, odynophagia or anorexia.   Denies any lower GI symptoms including constipation, diarrhea, rectal bleeding, melena or weight loss.  Past Medical History  Diagnosis Date  . Hypertension   . Hyperlipidemia   . H/O transurethral resection of prostate   . Prostate ca 2010    Past Surgical History  Procedure Date  . Colonoscopy 03/08/2007    Serrated adenoma rectal polyp   . Colonoscopy  12/02/2003     Left sided diverticula/ Pedunculated polyp left colon resected as described above    Current Outpatient Prescriptions  Medication Sig Dispense Refill  . aspirin 81 MG tablet Take 81 mg by mouth daily.      . fish oil-omega-3 fatty acids 1000 MG capsule Take 2 g by mouth daily.      . niacin (NIASPAN) 1000 MG CR tablet Take 1,000 mg by mouth at bedtime.      . peg 3350 powder (MOVIPREP) 100 G SOLR Take 1 kit (100 g total) by mouth once. As directed Please purchase 1 Fleets enema to use with the prep  1 kit  0    Allergies as of 02/10/2012  . (No Known Allergies)   Family history: There is no known family history of colorectal carcinoma , liver disease, or inflammatory bowel disease.  History   Social History  . Marital Status: Married    Spouse Name: N/A    Number of Children: 0  . Years of Education: N/A   Occupational History  . retired; [employer]    Social History Main Topics  . Smoking status: Former Smoker -- 0.5 packs/day for 40 years    Types: Cigarettes    Quit date: 02/04/2012  . Smokeless tobacco: Current User    Types: Chew   Comment: quit about  5-6 years  . Alcohol Use: Yes     one case in a week  . Drug Use: No  . Sexually Active: Not on file  Review of Systems: Gen: Denies any fever, chills, sweats, anorexia, fatigue, weakness, malaise, weight loss, and sleep disorder CV: Denies chest pain, angina, palpitations, syncope, orthopnea, PND, peripheral edema, and claudication. Resp: Denies dyspnea at rest, dyspnea with exercise, cough, sputum, wheezing, coughing up blood, and pleurisy. GI: Denies vomiting blood, jaundice, and fecal incontinence.   Denies dysphagia or odynophagia. GU : Denies urinary burning, blood in urine, urinary frequency, urinary hesitancy, nocturnal urination, and urinary incontinence. MS: Denies joint pain, limitation of movement, and swelling, stiffness, low back pain, extremity pain. Denies muscle weakness, cramps, atrophy.  Derm: Denies rash, itching, dry skin, hives, moles, warts, or unhealing ulcers.  Psych: Denies depression, anxiety, memory loss, suicidal ideation, hallucinations, paranoia, and confusion. Heme: Denies bruising, bleeding, and enlarged lymph nodes. Neuro:  Denies any headaches, dizziness, paresthesias. Endo:  Denies any problems with DM, thyroid, adrenal function.  Physical Exam: BP 140/80  Pulse 62  Temp(Src) 98.5 F (36.9 C) (Temporal)  Ht 5' 7.5" (1.715 m)  Wt 204 lb 3.2 oz (92.625 kg)  BMI 31.51 kg/m2 General:   Alert,  Well-developed, well-nourished, pleasant and cooperative in NAD Head:  Normocephalic and atraumatic. Eyes:  Sclera clear, no icterus.   Conjunctiva pink. Ears:  Normal auditory acuity. Nose:  No deformity, discharge, or lesions. Mouth:  No deformity or lesions,oropharynx pink & moist. Neck:  Supple; no masses or thyromegaly. Lungs:  Clear throughout to auscultation.   No wheezes, crackles, or rhonchi. No acute distress. Heart:  Regular rate and rhythm; no murmurs, clicks, rubs,  or gallops. Abdomen:  Normal bowel sounds.  No bruits.  Soft, non-tender and  non-distended without masses, hepatosplenomegaly or hernias noted.  No guarding or rebound tenderness.   Rectal:  Deferred. Msk:  Symmetrical without gross deformities. Normal posture. Pulses:  Normal pulses noted. Extremities:  No clubbing or edema. Neurologic:  Alert and  oriented x4;  grossly normal neurologically. Skin:  Intact without significant lesions or rashes. Lymph Nodes:  No significant cervical adenopathy. Psych:  Alert and cooperative. Normal mood and affect.

## 2012-02-16 NOTE — Discharge Instructions (Addendum)
Colonoscopy Discharge Instructions  Read the instructions outlined below and refer to this sheet in the next few weeks. These discharge instructions provide you with general information on caring for yourself after you leave the hospital. Your doctor may also give you specific instructions. While your treatment has been planned according to the most current medical practices available, unavoidable complications occasionally occur. If you have any problems or questions after discharge, call Dr. Rourk at [phone]. ACTIVITY  You may resume your regular activity, but move at a slower pace for the next 24 hours.   Take frequent rest periods for the next 24 hours.   Walking will help get rid of the air and reduce the bloated feeling in your belly (abdomen).   No driving for 24 hours (because of the medicine (anesthesia) used during the test).    Do not sign any important legal documents or operate any machinery for 24 hours (because of the anesthesia used during the test).  NUTRITION  Drink plenty of fluids.   You may resume your normal diet as instructed by your doctor.   Begin with a light meal and progress to your normal diet. Heavy or fried foods are harder to digest and may make you feel sick to your stomach (nauseated).   Avoid alcoholic beverages for 24 hours or as instructed.  MEDICATIONS  You may resume your normal medications unless your doctor tells you otherwise.  WHAT YOU CAN EXPECT TODAY  Some feelings of bloating in the abdomen.   Passage of more gas than usual.   Spotting of blood in your stool or on the toilet paper.  IF YOU HAD POLYPS REMOVED DURING THE COLONOSCOPY:  No aspirin products for 7 days or as instructed.   No alcohol for 7 days or as instructed.   Eat a soft diet for the next 24 hours.  FINDING OUT THE RESULTS OF YOUR TEST Not all test results are available during your visit. If your test results are not back during the visit, make an appointment  with your caregiver to find out the results. Do not assume everything is normal if you have not heard from your caregiver or the medical facility. It is important for you to follow up on all of your test results.  SEEK IMMEDIATE MEDICAL ATTENTION IF:  You have more than a spotting of blood in your stool.   Your belly is swollen (abdominal distention).   You are nauseated or vomiting.   You have a temperature over 101.   You have abdominal pain or discomfort that is severe or gets worse throughout the day.   Polyp and diverticulosis information provided.  Further recommendations to follow pending review of pathology report. Colon Polyps A polyp is extra tissue that grows inside your body. Colon polyps grow in the large intestine. The large intestine, also called the colon, is part of your digestive system. It is a long, hollow tube at the end of your digestive tract where your body makes and stores stool. Most polyps are not dangerous. They are benign. This means they are not cancerous. But over time, some types of polyps can turn into cancer. Polyps that are smaller than a pea are usually not harmful. But larger polyps could someday become or may already be cancerous. To be safe, doctors remove all polyps and test them.  WHO GETS POLYPS? Anyone can get polyps, but certain people are more likely than others. You may have a greater chance of getting polyps if:    You are over 50.   You have had polyps before.   Someone in your family has had polyps.   Someone in your family has had cancer of the large intestine.   Find out if someone in your family has had polyps. You may also be more likely to get polyps if you:   Eat a lot of fatty foods.   Smoke.   Drink alcohol.   Do not exercise.   Eat too much.  SYMPTOMS  Most small polyps do not cause symptoms. People often do not know they have one until their caregiver finds it during a regular checkup or while testing them for  something else. Some people do have symptoms like these:  Bleeding from the anus. You might notice blood on your underwear or on toilet paper after you have had a bowel movement.   Constipation or diarrhea that lasts more than a week.   Blood in the stool. Blood can make stool look black or it can show up as red streaks in the stool.  If you have any of these symptoms, see your caregiver. HOW DOES THE DOCTOR TEST FOR POLYPS? The doctor can use four tests to check for polyps:  Digital rectal exam. The caregiver wears gloves and checks your rectum (the last part of the large intestine) to see if it feels normal. This test would find polyps only in the rectum. Your caregiver may need to do one of the other tests listed below to find polyps higher up in the intestine.   Barium enema. The caregiver puts a liquid called barium into your rectum before taking x-rays of your large intestine. Barium makes your intestine look white in the pictures. Polyps are dark, so they are easy to see.   Sigmoidoscopy. With this test, the caregiver can see inside your large intestine. A thin flexible tube is placed into your rectum. The device is called a sigmoidoscope, which has a light and a tiny video camera in it. The caregiver uses the sigmoidoscope to look at the last third of your large intestine.   Colonoscopy. This test is like sigmoidoscopy, but the caregiver looks at all of the large intestine. It usually requires sedation. This is the most common method for finding and removing polyps.  TREATMENT   The caregiver will remove the polyp during sigmoidoscopy or colonoscopy. The polyp is then tested for cancer.   If you have had polyps, your caregiver may want you to get tested regularly in the future.  PREVENTION  There is not one sure way to prevent polyps. You might be able to lower your risk of getting them if you:  Eat more fruits and vegetables and less fatty food.   Do not smoke.   Avoid  alcohol.   Exercise every day.   Lose weight if you are overweight.   Eating more calcium and folate can also lower your risk of getting polyps. Some foods that are rich in calcium are milk, cheese, and broccoli. Some foods that are rich in folate are chickpeas, kidney beans, and spinach.   Aspirin might help prevent polyps. Studies are under way.  Document Released: 08/17/2004 Document Revised: 11/10/2011 Document Reviewed: 01/23/2008 Exit[employer] Patient Information 2012 [employer].  Diverticulosis Diverticulosis is a common condition that develops when small pouches (diverticula) form in the wall of the colon. The risk of diverticulosis increases with age. It happens more often in people who eat a low-fiber diet. Most individuals with diverticulosis have no symptoms.   Those individuals with symptoms usually experience abdominal pain, constipation, or loose stools (diarrhea). HOME CARE INSTRUCTIONS   Increase the amount of fiber in your diet as directed by your caregiver or dietician. This may reduce symptoms of diverticulosis.   Your caregiver may recommend taking a dietary fiber supplement.   Drink at least 6 to 8 glasses of water each day to prevent constipation.   Try not to strain when you have a bowel movement.   Your caregiver may recommend avoiding nuts and seeds to prevent complications, although this is still an uncertain benefit.   Only take over-the-counter or prescription medicines for pain, discomfort, or fever as directed by your caregiver.  FOODS WITH HIGH FIBER CONTENT INCLUDE:  Fruits. Apple, peach, pear, tangerine, raisins, prunes.   Vegetables. Brussels sprouts, asparagus, broccoli, cabbage, carrot, cauliflower, romaine lettuce, spinach, summer squash, tomato, winter squash, zucchini.   Starchy Vegetables. Baked beans, kidney beans, lima beans, split peas, lentils, potatoes (with skin).   Grains. Whole wheat bread, brown rice, bran flake cereal, plain oatmeal,  white rice, shredded wheat, bran muffins.  SEEK IMMEDIATE MEDICAL CARE IF:   You develop increasing pain or severe bloating.   You have an oral temperature above 102 F (38.9 C), not controlled by medicine.   You develop vomiting or bowel movements that are bloody or black.  Document Released: 08/18/2004 Document Revised: 11/10/2011 Document Reviewed: 04/21/2010 [employer] Patient Information 2012 [employer]. 

## 2012-02-16 NOTE — Interval H&P Note (Signed)
History and Physical Interval Note:  02/16/2012 8:33 AM  Howard Sawyer  has presented today for surgery, with the diagnosis of hx of colon polyps  The various methods of treatment have been discussed with the patient and family. After consideration of risks, benefits and other options for treatment, the patient has consented to  Procedure(s) (LRB): COLONOSCOPY (N/A) as a surgical intervention .  The patients' history has been reviewed, patient examined, no change in status, stable for surgery.  I have reviewed the patients' chart and labs.  Questions were answered to the patient's satisfaction.     Eula Listen

## 2012-02-16 NOTE — Op Note (Signed)
[facility] ([facility]) [address] [place], [place]  [address]  COLONOSCOPY PROCEDURE REPORT  PATIENT:  Howard, Sawyer  MR#:  [id number] BIRTHDATE:  07/25/1941, 70 yrs. old  GENDER:  male ENDOSCOPIST:  R. Roetta Sessions, MD FACP [facility] REF. BY:  Carylon Perches, M.D. PROCEDURE DATE:  02/16/2012 PROCEDURE:  colonoscopy with biopsy, polyp ablation and multiple snare polypectomies  INDICATIONS:  history of colonic adenoma  INFORMED CONSENT:  The risks, benefits, alternatives and imponderables including but not limited to bleeding, perforation as well as the possibility of a missed lesion have been reviewed. The potential for biopsy, lesion removal, etc. have also been discussed.  Questions have been answered.  All parties agreeable. Please see the history and physical in the medical record for more information.  MEDICATIONS:  Versed 4 mg IV a 75 mg IV in divided doses  DESCRIPTION OF PROCEDURE:  After a digital rectal exam was performed, the EC-[id number] ([id number]) colonoscope was advanced from the anus through the rectum and colon to the area of the cecum, ileocecal valve and appendiceal orifice.  The cecum was deeply intubated.  These structures were well-seen and photographed for the record.  From the level of the cecum and ileocecal valve, the scope was slowly and cautiously withdrawn.  The mucosal surfaces were carefully surveyed utilizing scope tip deflection to facilitate fold flattening as needed.  The scope was pulled down into the rectum where a thorough examination including retroflexion was performed. <<PROCEDUREIMAGES>>  FINDINGS: Adequate preparation. One 5 mm polyp in the rectum at 5 cm from the anal verge. There is adjacent diminutive polyp. Otherwise, the rectal mucosa appeared normal. Scattered colonic diverticulosis. Patient had multiple small polyps in the cecum and at the hepatic flexure. The largest was a 1 cm polyp at the hepatic flexure; the other cecal polyps  were diminutive to 4 mm in size.  THERAPEUTIC / DIAGNOSTIC MANEUVERS PERFORMED: the largest polyp the patient's lower GI tract at the hepatic flexure was hot snare removed with one pass of a hot snare loop. Multiple hot and cold snare polypectomies and ablation maneuvers with the tip of a hot snare loop were performed tio ablate and remove the remaining cecal and rectal polyps.s  COMPLICATIONS:  none  CECAL WITHDRAWAL TIME:18 minutes  IMPRESSION:  Multiple rectal and colonic polyps - removed and/or ablated as described above. Diverticulosis.  RECOMMENDATIONS:   Followup on pathology.  ______________________________ R. Roetta Sessions, MD Caleen Essex  CC:  Carylon Perches, M.D.  n. eSIGNED:   R. Roetta Sessions at 02/16/2012 09:35 AM  Victorino December, [phone]

## 2012-02-21 ENCOUNTER — Encounter: Payer: Self-pay | Admitting: Internal Medicine

## 2012-02-21 ENCOUNTER — Encounter (HOSPITAL_COMMUNITY): Payer: Self-pay | Admitting: Internal Medicine

## 2012-12-31 DIAGNOSIS — E119 Type 2 diabetes mellitus without complications: Secondary | ICD-10-CM | POA: Diagnosis not present

## 2012-12-31 DIAGNOSIS — E785 Hyperlipidemia, unspecified: Secondary | ICD-10-CM | POA: Diagnosis not present

## 2012-12-31 DIAGNOSIS — Z79899 Other long term (current) drug therapy: Secondary | ICD-10-CM | POA: Diagnosis not present

## 2013-01-08 DIAGNOSIS — C61 Malignant neoplasm of prostate: Secondary | ICD-10-CM | POA: Diagnosis not present

## 2013-01-08 DIAGNOSIS — E119 Type 2 diabetes mellitus without complications: Secondary | ICD-10-CM | POA: Diagnosis not present

## 2013-01-08 DIAGNOSIS — E785 Hyperlipidemia, unspecified: Secondary | ICD-10-CM | POA: Diagnosis not present

## 2013-05-07 DIAGNOSIS — C61 Malignant neoplasm of prostate: Secondary | ICD-10-CM | POA: Diagnosis not present

## 2013-05-16 DIAGNOSIS — C61 Malignant neoplasm of prostate: Secondary | ICD-10-CM | POA: Diagnosis not present

## 2013-07-03 DIAGNOSIS — E119 Type 2 diabetes mellitus without complications: Secondary | ICD-10-CM | POA: Diagnosis not present

## 2013-07-03 DIAGNOSIS — Z79899 Other long term (current) drug therapy: Secondary | ICD-10-CM | POA: Diagnosis not present

## 2013-07-10 DIAGNOSIS — E119 Type 2 diabetes mellitus without complications: Secondary | ICD-10-CM | POA: Diagnosis not present

## 2013-09-24 DIAGNOSIS — Z23 Encounter for immunization: Secondary | ICD-10-CM | POA: Diagnosis not present

## 2014-01-10 DIAGNOSIS — Z79899 Other long term (current) drug therapy: Secondary | ICD-10-CM | POA: Diagnosis not present

## 2014-01-10 DIAGNOSIS — E119 Type 2 diabetes mellitus without complications: Secondary | ICD-10-CM | POA: Diagnosis not present

## 2014-07-11 DIAGNOSIS — E119 Type 2 diabetes mellitus without complications: Secondary | ICD-10-CM | POA: Diagnosis not present

## 2014-08-05 DIAGNOSIS — Z23 Encounter for immunization: Secondary | ICD-10-CM | POA: Diagnosis not present

## 2014-08-05 DIAGNOSIS — C61 Malignant neoplasm of prostate: Secondary | ICD-10-CM | POA: Diagnosis not present

## 2014-08-13 DIAGNOSIS — C61 Malignant neoplasm of prostate: Secondary | ICD-10-CM | POA: Diagnosis not present

## 2014-09-29 DIAGNOSIS — Z23 Encounter for immunization: Secondary | ICD-10-CM | POA: Diagnosis not present

## 2015-02-02 DIAGNOSIS — Z0001 Encounter for general adult medical examination with abnormal findings: Secondary | ICD-10-CM | POA: Diagnosis not present

## 2015-02-05 ENCOUNTER — Encounter: Payer: Self-pay | Admitting: Internal Medicine

## 2015-02-25 ENCOUNTER — Other Ambulatory Visit: Payer: Self-pay

## 2015-02-25 ENCOUNTER — Ambulatory Visit (INDEPENDENT_AMBULATORY_CARE_PROVIDER_SITE_OTHER): Payer: Medicare Other | Admitting: Nurse Practitioner

## 2015-02-25 ENCOUNTER — Encounter: Payer: Self-pay | Admitting: Nurse Practitioner

## 2015-02-25 VITALS — BP 163/84 | HR 79 | Temp 97.9°F | Ht 68.0 in | Wt 192.2 lb

## 2015-02-25 DIAGNOSIS — D126 Benign neoplasm of colon, unspecified: Secondary | ICD-10-CM

## 2015-02-25 DIAGNOSIS — Z86010 Personal history of colonic polyps: Secondary | ICD-10-CM

## 2015-02-25 MED ORDER — PEG 3350-KCL-NA BICARB-NACL 420 G PO SOLR: 4000.0000 mL | Freq: Once | ORAL | Status: DC

## 2015-02-25 NOTE — Assessment & Plan Note (Signed)
Last colonoscopy 3 years ago with multiple polyps removed, 1 cm hepatic flexure polyp which was adenomatous on pathology. Recommended 3 year follow-up. Patient is asymptomatic at this point, no reg flag/warning signs/symptoms. Will proceed with surveillance colonoscopy with Dr. Gala Romney as per recommendations.  Proceed with TCS with Dr. Gala Romney in near future: the risks, benefits, and alternatives have been discussed with the patient in detail. The patient states understanding and desires to proceed.  No anticoagulants other than 81 mg ASA. No diabetes medications.l

## 2015-02-25 NOTE — Progress Notes (Signed)
cc'ed to pcp °

## 2015-02-25 NOTE — Patient Instructions (Signed)
1. We will schedule your procedure for you (colonoscopy) 2. Further fecommendations and when to have a repeat exam to be based on the results of your procedure.

## 2015-02-25 NOTE — Progress Notes (Signed)
Referring Provider: Asencion Noble, MD Primary Care Physician:  Asencion Noble, MD Primary GI:  Dr. Gala Romney  Chief Complaint  Patient presents with  . Colonoscopy    HPI:   74 year old male presents for a visit for repeat surveillance colonoscopy. Denies abdominal pain, changes in bowel habits, hematochezia, melena, unintentional weight loss, weakness, fatigue, lethargy, unexplained fever/chills. Denies any other upper or lower GI symptoms.   Past Medical History  Diagnosis Date  . Hypertension   . Hyperlipidemia   . Prostate ca 2010  . Colon polyps     Past Surgical History  Procedure Laterality Date  . Colonoscopy  03/08/2007    Serrated adenoma rectal polyp   . Colonoscopy   12/02/2003     Left sided diverticula/ Pedunculated polyp left colon resected as described above  . Prostatectomy    . Colonoscopy  02/16/2012    RMR:  Multiple rectal and colonic polyps-removed and/or ablated as described above.     Current Outpatient Prescriptions  Medication Sig Dispense Refill  . aspirin EC 81 MG tablet Take 81 mg by mouth daily.    . fish oil-omega-3 fatty acids 1000 MG capsule Take 3 g by mouth daily.     Marland Kitchen losartan (COZAAR) 100 MG tablet     . niacin 500 MG CR capsule Take 1,000 mg by mouth at bedtime.    . peg 3350 powder (MOVIPREP) 100 G SOLR Take 1 kit (100 g total) by mouth once. As directed Please purchase 1 Fleets enema to use with the prep (Patient not taking: Reported on 02/25/2015) 1 kit 0   No current facility-administered medications for this visit.    Allergies as of 02/25/2015  . (No Known Allergies)    Family History  Problem Relation Age of Onset  . Colon cancer Neg Hx     History   Social History  . Marital Status: Married    Spouse Name: N/A  . Number of Children: 0  . Years of Education: N/A   Occupational History  . retired; [employer]    Social History Main Topics  . Smoking status: Former Smoker -- 0.50 packs/day for 40 years    Types:  Cigarettes    Quit date: 02/04/2012  . Smokeless tobacco: Current User    Types: Chew     Comment: quit about 5-6 years  . Alcohol Use: 14.4 oz/week    24 Cans of beer per week     Comment: one case in a week  . Drug Use: No  . Sexual Activity: Not on file   Other Topics Concern  . None   Social History Narrative    Review of Systems: Gen: Denies fever, chills, anorexia. Denies fatigue, weakness, weight loss.  CV: Denies chest pain, palpitations, syncope, peripheral edema. Resp: Denies dyspnea at rest, wheezing, coughing up blood. GI: Denies vomiting blood, jaundice, and fecal incontinence.   Denies dysphagia or odynophagia. Derm: Denies rash, itching, dry skin Psych: Denies depression, anxiety, memory loss, confusion.  Heme: Denies bruising, bleeding, and enlarged lymph nodes.  Physical Exam: BP 163/84 mmHg  Pulse 79  Temp(Src) 97.9 F (36.6 C) (Oral)  Ht 5' 8"  (1.727 m)  Wt 192 lb 3.2 oz (87.181 kg)  BMI 29.23 kg/m2 General:   Alert and oriented. No distress noted. Pleasant and cooperative.  Head:  Normocephalic and atraumatic. Eyes:  Conjuctiva clear without scleral icterus. Mouth:  Oral mucosa pink and moist. Good dentition. No lesions. No OP edema. Neck:  Supple, without mass or thyromegaly. Lungs:  Clear to auscultation bilaterally. No wheezes, rales, or rhonchi. No distress.  Heart:  S1, S2 present without murmurs, rubs, or gallops. Regular rate and rhythm. Abdomen:  +BS, soft, non-tender and non-distended. No rebound or guarding. No HSM or masses noted. Msk:  Symmetrical without gross deformities. Normal posture. Pulses:  2+ DP noted bilaterally Extremities:  Without edema. Neurologic:  Alert and  oriented x4;  grossly normal neurologically. Skin:  Intact without significant lesions or rashes. Cervical Nodes:  No significant cervical adenopathy. Psych:  Alert and cooperative. Normal mood and affect.    02/25/2015 2:36 PM

## 2015-03-16 ENCOUNTER — Ambulatory Visit (HOSPITAL_COMMUNITY)
Admission: RE | Admit: 2015-03-16 | Discharge: 2015-03-16 | Disposition: A | Discharge status: Home / Self Care | Payer: Medicare Other | Source: Ambulatory Visit | Attending: Internal Medicine | Admitting: Internal Medicine

## 2015-03-16 ENCOUNTER — Encounter (HOSPITAL_COMMUNITY)
Admission: RE | Disposition: A | Discharge status: Home / Self Care | Payer: Self-pay | Source: Ambulatory Visit | Attending: Internal Medicine

## 2015-03-16 ENCOUNTER — Encounter (HOSPITAL_COMMUNITY): Payer: Self-pay

## 2015-03-16 DIAGNOSIS — D124 Benign neoplasm of descending colon: Secondary | ICD-10-CM | POA: Diagnosis not present

## 2015-03-16 DIAGNOSIS — I10 Essential (primary) hypertension: Secondary | ICD-10-CM | POA: Diagnosis not present

## 2015-03-16 DIAGNOSIS — Z860100 Personal history of colon polyps, unspecified: Secondary | ICD-10-CM | POA: Insufficient documentation

## 2015-03-16 DIAGNOSIS — Z8546 Personal history of malignant neoplasm of prostate: Secondary | ICD-10-CM | POA: Diagnosis not present

## 2015-03-16 DIAGNOSIS — Z9079 Acquired absence of other genital organ(s): Secondary | ICD-10-CM | POA: Insufficient documentation

## 2015-03-16 DIAGNOSIS — D120 Benign neoplasm of cecum: Secondary | ICD-10-CM

## 2015-03-16 DIAGNOSIS — Z87891 Personal history of nicotine dependence: Secondary | ICD-10-CM | POA: Diagnosis not present

## 2015-03-16 DIAGNOSIS — K5730 Diverticulosis of large intestine without perforation or abscess without bleeding: Secondary | ICD-10-CM

## 2015-03-16 DIAGNOSIS — D122 Benign neoplasm of ascending colon: Secondary | ICD-10-CM | POA: Diagnosis not present

## 2015-03-16 DIAGNOSIS — Z79899 Other long term (current) drug therapy: Secondary | ICD-10-CM | POA: Insufficient documentation

## 2015-03-16 DIAGNOSIS — Z09 Encounter for follow-up examination after completed treatment for conditions other than malignant neoplasm: Secondary | ICD-10-CM | POA: Diagnosis present

## 2015-03-16 DIAGNOSIS — D125 Benign neoplasm of sigmoid colon: Secondary | ICD-10-CM | POA: Diagnosis not present

## 2015-03-16 DIAGNOSIS — E785 Hyperlipidemia, unspecified: Secondary | ICD-10-CM | POA: Insufficient documentation

## 2015-03-16 DIAGNOSIS — K6389 Other specified diseases of intestine: Secondary | ICD-10-CM | POA: Diagnosis not present

## 2015-03-16 DIAGNOSIS — Z7982 Long term (current) use of aspirin: Secondary | ICD-10-CM | POA: Insufficient documentation

## 2015-03-16 HISTORY — PX: COLONOSCOPY: SHX5424

## 2015-03-16 SURGERY — COLONOSCOPY
Anesthesia: Moderate Sedation

## 2015-03-16 MED ORDER — MEPERIDINE HCL 100 MG/ML IJ SOLN
INTRAMUSCULAR | Status: AC
  Filled 2015-03-16: qty 2

## 2015-03-16 MED ORDER — ONDANSETRON HCL 4 MG/2ML IJ SOLN
INTRAMUSCULAR | Status: DC | PRN
  Administered 2015-03-16: 4 mg via INTRAVENOUS

## 2015-03-16 MED ORDER — SIMETHICONE 40 MG/0.6ML PO SUSP
ORAL | Status: DC | PRN
  Administered 2015-03-16: 13:00:00

## 2015-03-16 MED ORDER — MIDAZOLAM HCL 5 MG/5ML IJ SOLN
INTRAMUSCULAR | Status: AC
  Filled 2015-03-16: qty 10

## 2015-03-16 MED ORDER — ONDANSETRON HCL 4 MG/2ML IJ SOLN
INTRAMUSCULAR | Status: AC
  Filled 2015-03-16: qty 2

## 2015-03-16 MED ORDER — MIDAZOLAM HCL 5 MG/5ML IJ SOLN
INTRAMUSCULAR | Status: DC | PRN
  Administered 2015-03-16: 1 mg via INTRAVENOUS
  Administered 2015-03-16: 2 mg via INTRAVENOUS
  Administered 2015-03-16 (×2): 1 mg via INTRAVENOUS
  Administered 2015-03-16: 2 mg via INTRAVENOUS
  Administered 2015-03-16: 1 mg via INTRAVENOUS

## 2015-03-16 MED ORDER — MEPERIDINE HCL 100 MG/ML IJ SOLN
INTRAMUSCULAR | Status: DC | PRN
  Administered 2015-03-16 (×2): 50 mg via INTRAVENOUS
  Administered 2015-03-16: 25 mg via INTRAVENOUS

## 2015-03-16 NOTE — Interval H&P Note (Signed)
History and Physical Interval Note:  03/16/2015 1:19 PM  Howard Sawyer  has presented today for surgery, with the diagnosis of hx of polyps  The various methods of treatment have been discussed with the patient and family. After consideration of risks, benefits and other options for treatment, the patient has consented to  Procedure(s) with comments: COLONOSCOPY (N/A) - 115pm - moved to 1:15 - office to notify as a surgical intervention .  The patient's history has been reviewed, patient examined, no change in status, stable for surgery.  I have reviewed the patient's chart and labs.  Questions were answered to the patient's satisfaction.     Anes Rigel  No change. Surveillance colonoscopy per plan.The risks, benefits, limitations, alternatives and imponderables have been reviewed with the patient. Questions have been answered. All parties are agreeable.

## 2015-03-16 NOTE — H&P (View-Only) (Signed)
Referring Provider: Asencion Noble, MD Primary Care Physician:  Asencion Noble, MD Primary GI:  Dr. Gala Romney  Chief Complaint  Patient presents with  . Colonoscopy    HPI:   74 year old male presents for a visit for repeat surveillance colonoscopy. Denies abdominal pain, changes in bowel habits, hematochezia, melena, unintentional weight loss, weakness, fatigue, lethargy, unexplained fever/chills. Denies any other upper or lower GI symptoms.   Past Medical History  Diagnosis Date  . Hypertension   . Hyperlipidemia   . Prostate ca 2010  . Colon polyps     Past Surgical History  Procedure Laterality Date  . Colonoscopy  03/08/2007    Serrated adenoma rectal polyp   . Colonoscopy   12/02/2003     Left sided diverticula/ Pedunculated polyp left colon resected as described above  . Prostatectomy    . Colonoscopy  02/16/2012    RMR:  Multiple rectal and colonic polyps-removed and/or ablated as described above.     Current Outpatient Prescriptions  Medication Sig Dispense Refill  . aspirin EC 81 MG tablet Take 81 mg by mouth daily.    . fish oil-omega-3 fatty acids 1000 MG capsule Take 3 g by mouth daily.     Marland Kitchen losartan (COZAAR) 100 MG tablet     . niacin 500 MG CR capsule Take 1,000 mg by mouth at bedtime.    . peg 3350 powder (MOVIPREP) 100 G SOLR Take 1 kit (100 g total) by mouth once. As directed Please purchase 1 Fleets enema to use with the prep (Patient not taking: Reported on 02/25/2015) 1 kit 0   No current facility-administered medications for this visit.    Allergies as of 02/25/2015  . (No Known Allergies)    Family History  Problem Relation Age of Onset  . Colon cancer Neg Hx     History   Social History  . Marital Status: Married    Spouse Name: N/A  . Number of Children: 0  . Years of Education: N/A   Occupational History  . retired; [employer]    Social History Main Topics  . Smoking status: Former Smoker -- 0.50 packs/day for 40 years    Types:  Cigarettes    Quit date: 02/04/2012  . Smokeless tobacco: Current User    Types: Chew     Comment: quit about 5-6 years  . Alcohol Use: 14.4 oz/week    24 Cans of beer per week     Comment: one case in a week  . Drug Use: No  . Sexual Activity: Not on file   Other Topics Concern  . None   Social History Narrative    Review of Systems: Gen: Denies fever, chills, anorexia. Denies fatigue, weakness, weight loss.  CV: Denies chest pain, palpitations, syncope, peripheral edema. Resp: Denies dyspnea at rest, wheezing, coughing up blood. GI: Denies vomiting blood, jaundice, and fecal incontinence.   Denies dysphagia or odynophagia. Derm: Denies rash, itching, dry skin Psych: Denies depression, anxiety, memory loss, confusion.  Heme: Denies bruising, bleeding, and enlarged lymph nodes.  Physical Exam: BP 163/84 mmHg  Pulse 79  Temp(Src) 97.9 F (36.6 C) (Oral)  Ht 5' 8"  (1.727 m)  Wt 192 lb 3.2 oz (87.181 kg)  BMI 29.23 kg/m2 General:   Alert and oriented. No distress noted. Pleasant and cooperative.  Head:  Normocephalic and atraumatic. Eyes:  Conjuctiva clear without scleral icterus. Mouth:  Oral mucosa pink and moist. Good dentition. No lesions. No OP edema. Neck:  Supple, without mass or thyromegaly. Lungs:  Clear to auscultation bilaterally. No wheezes, rales, or rhonchi. No distress.  Heart:  S1, S2 present without murmurs, rubs, or gallops. Regular rate and rhythm. Abdomen:  +BS, soft, non-tender and non-distended. No rebound or guarding. No HSM or masses noted. Msk:  Symmetrical without gross deformities. Normal posture. Pulses:  2+ DP noted bilaterally Extremities:  Without edema. Neurologic:  Alert and  oriented x4;  grossly normal neurologically. Skin:  Intact without significant lesions or rashes. Cervical Nodes:  No significant cervical adenopathy. Psych:  Alert and cooperative. Normal mood and affect.    02/25/2015 2:36 PM
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
## 2015-03-16 NOTE — Discharge Instructions (Addendum)
Colonoscopy Discharge Instructions  Read the instructions outlined below and refer to this sheet in the next few weeks. These discharge instructions provide you with general information on caring for yourself after you leave the hospital. Your doctor may also give you specific instructions. While your treatment has been planned according to the most current medical practices available, unavoidable complications occasionally occur. If you have any problems or questions after discharge, call Dr. Gala Romney at [phone]. ACTIVITY  You may resume your regular activity, but move at a slower pace for the next 24 hours.   Take frequent rest periods for the next 24 hours.   Walking will help get rid of the air and reduce the bloated feeling in your belly (abdomen).   No driving for 24 hours (because of the medicine (anesthesia) used during the test).    Do not sign any important legal documents or operate any machinery for 24 hours (because of the anesthesia used during the test).  NUTRITION  Drink plenty of fluids.   You may resume your normal diet as instructed by your doctor.   Begin with a light meal and progress to your normal diet. Heavy or fried foods are harder to digest and may make you feel sick to your stomach (nauseated).   Avoid alcoholic beverages for 24 hours or as instructed.  MEDICATIONS  You may resume your normal medications unless your doctor tells you otherwise.  WHAT YOU CAN EXPECT TODAY  Some feelings of bloating in the abdomen.   Passage of more gas than usual.   Spotting of blood in your stool or on the toilet paper.  IF YOU HAD POLYPS REMOVED DURING THE COLONOSCOPY:  No aspirin products for 7 days or as instructed.   No alcohol for 7 days or as instructed.   Eat a soft diet for the next 24 hours.  FINDING OUT THE RESULTS OF YOUR TEST Not all test results are available during your visit. If your test results are not back during the visit, make an appointment  with your caregiver to find out the results. Do not assume everything is normal if you have not heard from your caregiver or the medical facility. It is important for you to follow up on all of your test results.  SEEK IMMEDIATE MEDICAL ATTENTION IF:  You have more than a spotting of blood in your stool.   Your belly is swollen (abdominal distention).   You are nauseated or vomiting.   You have a temperature over 101.   You have abdominal pain or discomfort that is severe or gets worse throughout the day.     A polyp and diverticulosis information provided  Further recommendations to follow any review of pathology report  Do not take any aspirin or nonsteroidal drugs like ibuprofen or Motrin for 7 days   Diverticulosis Diverticulosis is the condition that develops when small pouches (diverticula) form in the wall of your colon. Your colon, or large intestine, is where water is absorbed and stool is formed. The pouches form when the inside layer of your colon pushes through weak spots in the outer layers of your colon. CAUSES  No one knows exactly what causes diverticulosis. RISK FACTORS  Being older than 100. Your risk for this condition increases with age. Diverticulosis is rare in people younger than 40 years. By age 40, almost everyone has it.  Eating a low-fiber diet.  Being frequently constipated.  Being overweight.  Not getting enough exercise.  Smoking.  Taking over-the-counter  pain medicines, like aspirin and ibuprofen. SYMPTOMS  Most people with diverticulosis do not have symptoms. DIAGNOSIS  Because diverticulosis often has no symptoms, health care providers often discover the condition during an exam for other colon problems. In many cases, a health care provider will diagnose diverticulosis while using a flexible scope to examine the colon (colonoscopy). TREATMENT  If you have never developed an infection related to diverticulosis, you may not need treatment.  If you have had an infection before, treatment may include:  Eating more fruits, vegetables, and grains.  Taking a fiber supplement.  Taking a live bacteria supplement (probiotic).  Taking medicine to relax your colon. HOME CARE INSTRUCTIONS   Drink at least 6-8 glasses of water each day to prevent constipation.  Try not to strain when you have a bowel movement.  Keep all follow-up appointments. If you have had an infection before:  Increase the fiber in your diet as directed by your health care provider or dietitian.  Take a dietary fiber supplement if your health care provider approves.  Only take medicines as directed by your health care provider. SEEK MEDICAL CARE IF:   You have abdominal pain.  You have bloating.  You have cramps.  You have not gone to the bathroom in 3 days. SEEK IMMEDIATE MEDICAL CARE IF:   Your pain gets worse.  Yourbloating becomes very bad.  You have a fever or chills, and your symptoms suddenly get worse.  You begin vomiting.  You have bowel movements that are bloody or black. MAKE SURE YOU:  Understand these instructions.  Will watch your condition.  Will get help right away if you are not doing well or get worse. Document Released: 08/18/2004 Document Revised: 11/26/2013 Document Reviewed: 10/16/2013 [facility] Patient Information 2015 [place], [place]. This information is not intended to replace advice given to you by your health care provider. Make sure you discuss any questions you have with your health care provider.   Colon Polyps Polyps are lumps of extra tissue growing inside the body. Polyps can grow in the large intestine (colon). Most colon polyps are noncancerous (benign). However, some colon polyps can become cancerous over time. Polyps that are larger than a pea may be harmful. To be safe, caregivers remove and test all polyps. CAUSES  Polyps form when mutations in the genes cause your cells to grow and divide even  though no more tissue is needed. RISK FACTORS There are a number of risk factors that can increase your chances of getting colon polyps. They include: Being older than 50 years. Family history of colon polyps or colon cancer. Long-term colon diseases, such as colitis or Crohn disease. Being overweight. Smoking. Being inactive. Drinking too much alcohol. SYMPTOMS  Most small polyps do not cause symptoms. If symptoms are present, they may include: Blood in the stool. The stool may look dark red or black. Constipation or diarrhea that lasts longer than 1 week. DIAGNOSIS People often do not know they have polyps until their caregiver finds them during a regular checkup. Your caregiver can use 4 tests to check for polyps: Digital rectal exam. The caregiver wears gloves and feels inside the rectum. This test would find polyps only in the rectum. Barium enema. The caregiver puts a liquid called barium into your rectum before taking X-rays of your colon. Barium makes your colon look white. Polyps are dark, so they are easy to see in the X-ray pictures. Sigmoidoscopy. A thin, flexible tube (sigmoidoscope) is placed into your rectum. The  sigmoidoscope has a light and tiny camera in it. The caregiver uses the sigmoidoscope to look at the last third of your colon. Colonoscopy. This test is like sigmoidoscopy, but the caregiver looks at the entire colon. This is the most common method for finding and removing polyps. TREATMENT  Any polyps will be removed during a sigmoidoscopy or colonoscopy. The polyps are then tested for cancer. PREVENTION  To help lower your risk of getting more colon polyps: Eat plenty of fruits and vegetables. Avoid eating fatty foods. Do not smoke. Avoid drinking alcohol. Exercise every day. Lose weight if recommended by your caregiver. Eat plenty of calcium and folate. Foods that are rich in calcium include milk, cheese, and broccoli. Foods that are rich in folate include  chickpeas, kidney beans, and spinach. HOME CARE INSTRUCTIONS Keep all follow-up appointments as directed by your caregiver. You may need periodic exams to check for polyps. SEEK MEDICAL CARE IF: You notice bleeding during a bowel movement. Document Released: 08/17/2004 Document Revised: 02/13/2012 Document Reviewed: 01/31/2012 [facility] Patient Information 2015 [place], [place]. This information is not intended to replace advice given to you by your health care provider. Make sure you discuss any questions you have with your health care provider.

## 2015-03-17 ENCOUNTER — Encounter (HOSPITAL_COMMUNITY): Payer: Self-pay | Admitting: Internal Medicine

## 2015-03-19 ENCOUNTER — Encounter: Payer: Self-pay | Admitting: Internal Medicine

## 2015-07-27 DIAGNOSIS — E119 Type 2 diabetes mellitus without complications: Secondary | ICD-10-CM | POA: Diagnosis not present

## 2015-08-04 DIAGNOSIS — Z8546 Personal history of malignant neoplasm of prostate: Secondary | ICD-10-CM | POA: Diagnosis not present

## 2015-08-04 DIAGNOSIS — E1129 Type 2 diabetes mellitus with other diabetic kidney complication: Secondary | ICD-10-CM | POA: Diagnosis not present

## 2015-08-04 DIAGNOSIS — Z6829 Body mass index (BMI) 29.0-29.9, adult: Secondary | ICD-10-CM | POA: Diagnosis not present

## 2015-08-18 ENCOUNTER — Ambulatory Visit (INDEPENDENT_AMBULATORY_CARE_PROVIDER_SITE_OTHER): Payer: Medicare Other | Admitting: Internal Medicine

## 2015-08-18 ENCOUNTER — Other Ambulatory Visit: Payer: Self-pay

## 2015-08-18 ENCOUNTER — Encounter: Payer: Self-pay | Admitting: Internal Medicine

## 2015-08-18 VITALS — BP 165/82 | HR 81 | Temp 98.6°F | Ht 68.0 in | Wt 189.6 lb

## 2015-08-18 DIAGNOSIS — Z86010 Personal history of colonic polyps: Secondary | ICD-10-CM

## 2015-08-18 MED ORDER — PEG 3350-KCL-NA BICARB-NACL 420 G PO SOLR: 4000.0000 mL | ORAL | Status: DC

## 2015-08-18 NOTE — Patient Instructions (Signed)
Schedule surveillance colonoscopy (hx of colon polyps)  Split prep  Phenergan 25 mg IV at hospital on day of procedure  Further recommendations to follow

## 2015-08-18 NOTE — Progress Notes (Signed)
Primary Care Physician:  Asencion Noble, MD Primary Gastroenterologist:  Dr. Gala Romney  Pre-Procedure History & Physical: HPI:  Howard Sawyer is a 74 y.o. male here to set up a surveillance colonoscopy. Patient has had multiple colonoscopies  - the last one being in March of this year. He had multiple colonic adenomas removed. A flat ascending colon polyp which was removed in piecemeal fashion. No high-grade dysplasia was seen on pathology. He has no GI symptoms at this time. Given the multiplicity of polyps and piecemeal removal earlier this year, it is appropriate to bring him back for follow-up colonoscopy at this time the  Past Medical History  Diagnosis Date  . Hypertension   . Hyperlipidemia   . Prostate ca 2010  . Colon polyps     tubular adenoma  . Diverticulosis     Past Surgical History  Procedure Laterality Date  . Colonoscopy  03/08/2007    Serrated adenoma rectal polyp   . Colonoscopy   12/02/2003     Left sided diverticula/ Pedunculated polyp left colon resected as described above  . Prostatectomy    . Colonoscopy  02/16/2012    RMR:  Multiple rectal and colonic polyps-removed and/or ablated as described above.   . Colonoscopy N/A 03/16/2015    Dr.Nakota Ackert- multiple colonic polyps, colonic diverticulosis, redundant colon bx= tubular adenoma    Prior to Admission medications   Medication Sig Start Date End Date Taking? Authorizing Provider  aspirin EC 81 MG tablet Take 81 mg by mouth daily.   Yes Historical Provider, MD  fish oil-omega-3 fatty acids 1000 MG capsule Take 3 g by mouth daily.    Yes Historical Provider, MD  losartan (COZAAR) 100 MG tablet  12/30/14  Yes Historical Provider, MD  niacin 500 MG CR capsule Take 1,000 mg by mouth at bedtime.    Historical Provider, MD  polyethylene glycol-electrolytes (NULYTELY/GOLYTELY) 420 G solution Take 4,000 mLs by mouth once. Patient not taking: Reported on 08/18/2015 02/25/15   Carlis Stable, NP    Allergies as of 08/18/2015    . (No Known Allergies)    Family History  Problem Relation Age of Onset  . Colon cancer Neg Hx     Social History   Social History  . Marital Status: Married    Spouse Name: N/A  . Number of Children: 0  . Years of Education: N/A   Occupational History  . retired; [employer]    Social History Main Topics  . Smoking status: Former Smoker -- 0.50 packs/day for 40 years    Types: Cigarettes    Quit date: 02/04/2012  . Smokeless tobacco: Current User    Types: Chew     Comment: quit about 5-6 years  . Alcohol Use: 14.4 oz/week    24 Cans of beer per week     Comment: one case in a week  . Drug Use: No  . Sexual Activity: Not on file   Other Topics Concern  . Not on file   Social History Narrative    Review of Systems: See HPI, otherwise negative ROS  Physical Exam: BP 165/82 mmHg  Pulse 81  Temp(Src) 98.6 F (37 C)  Ht 5\' 8"  (1.727 m)  Wt 189 lb 9.6 oz (86.002 kg)  BMI 28.84 kg/m2 General:   Alert,   pleasant and cooperative in NAD Skin:  Intact without significant lesions or rashes. Eyes:  Sclera clear, no icterus.   Conjunctiva pink. Ears:  Normal auditory acuity. Nose:  No deformity, discharge,  or lesions. Mouth:  No deformity or lesions. Neck:  Supple; no masses or thyromegaly. No significant cervical adenopathy. Lungs:  Clear throughout to auscultation.   No wheezes, crackles, or rhonchi. No acute distress. Heart:  Regular rate and rhythm; no murmurs, clicks, rubs,  or gallops. Abdomen: Non-distended, normal bowel sounds.  Soft and nontender without appreciable mass or hepatosplenomegaly.  Pulses:  Normal pulses noted. Extremities:  Without clubbing or edema.  Impression:  Pleasant 74 year old male with a history of multiple colonic polyps removed over time. Multiple colonic polyps removed earlier this year  With an ascending colon lesion removed in piecemeal fashion. He is due for early surveillance colonoscopy at this time. Discussed the reasoning  behind this approach at length with him today.  Recommendations:  Surveillance colonoscopy in the near future. Split preparation. We'll augment conscious sedation with phenergan 25 mg IV prior to the procedure. Further recommendations to follow.The risks, benefits, limitations, alternatives and imponderables have been reviewed with the patient. Questions have been answered. All parties are agreeable.      Notice: This dictation was prepared with Dragon dictation along with smaller phrase technology. Any transcriptional errors that result from this process are unintentional and may not be corrected upon review.

## 2015-08-25 ENCOUNTER — Encounter: Payer: Self-pay | Admitting: Internal Medicine

## 2015-09-07 ENCOUNTER — Encounter (HOSPITAL_COMMUNITY): Payer: Self-pay | Admitting: *Deleted

## 2015-09-07 ENCOUNTER — Encounter (HOSPITAL_COMMUNITY)
Admission: RE | Disposition: A | Discharge status: Home / Self Care | Payer: Self-pay | Source: Ambulatory Visit | Attending: Internal Medicine

## 2015-09-07 ENCOUNTER — Ambulatory Visit (HOSPITAL_COMMUNITY)
Admission: RE | Admit: 2015-09-07 | Discharge: 2015-09-07 | Disposition: A | Discharge status: Home / Self Care | Payer: Medicare Other | Source: Ambulatory Visit | Attending: Internal Medicine | Admitting: Internal Medicine

## 2015-09-07 DIAGNOSIS — D122 Benign neoplasm of ascending colon: Secondary | ICD-10-CM | POA: Diagnosis not present

## 2015-09-07 DIAGNOSIS — K621 Rectal polyp: Secondary | ICD-10-CM

## 2015-09-07 DIAGNOSIS — K5730 Diverticulosis of large intestine without perforation or abscess without bleeding: Secondary | ICD-10-CM | POA: Diagnosis not present

## 2015-09-07 DIAGNOSIS — D128 Benign neoplasm of rectum: Secondary | ICD-10-CM | POA: Insufficient documentation

## 2015-09-07 DIAGNOSIS — Z1211 Encounter for screening for malignant neoplasm of colon: Secondary | ICD-10-CM | POA: Insufficient documentation

## 2015-09-07 DIAGNOSIS — Z87891 Personal history of nicotine dependence: Secondary | ICD-10-CM | POA: Diagnosis not present

## 2015-09-07 DIAGNOSIS — Z7982 Long term (current) use of aspirin: Secondary | ICD-10-CM | POA: Diagnosis not present

## 2015-09-07 DIAGNOSIS — Z8546 Personal history of malignant neoplasm of prostate: Secondary | ICD-10-CM | POA: Diagnosis not present

## 2015-09-07 DIAGNOSIS — E785 Hyperlipidemia, unspecified: Secondary | ICD-10-CM | POA: Insufficient documentation

## 2015-09-07 DIAGNOSIS — Z79899 Other long term (current) drug therapy: Secondary | ICD-10-CM | POA: Insufficient documentation

## 2015-09-07 DIAGNOSIS — I10 Essential (primary) hypertension: Secondary | ICD-10-CM | POA: Insufficient documentation

## 2015-09-07 DIAGNOSIS — Z86010 Personal history of colonic polyps: Secondary | ICD-10-CM | POA: Diagnosis not present

## 2015-09-07 HISTORY — PX: COLONOSCOPY: SHX5424

## 2015-09-07 SURGERY — COLONOSCOPY
Anesthesia: Moderate Sedation

## 2015-09-07 MED ORDER — ONDANSETRON HCL 4 MG/2ML IJ SOLN
INTRAMUSCULAR | Status: AC
  Filled 2015-09-07: qty 2

## 2015-09-07 MED ORDER — MIDAZOLAM HCL 5 MG/5ML IJ SOLN
INTRAMUSCULAR | Status: AC
  Filled 2015-09-07: qty 10

## 2015-09-07 MED ORDER — MIDAZOLAM HCL 5 MG/5ML IJ SOLN
INTRAMUSCULAR | Status: DC | PRN
  Administered 2015-09-07: 1 mg via INTRAVENOUS
  Administered 2015-09-07: 2 mg via INTRAVENOUS
  Administered 2015-09-07 (×2): 1 mg via INTRAVENOUS

## 2015-09-07 MED ORDER — STERILE WATER FOR IRRIGATION IR SOLN
Status: DC | PRN
  Administered 2015-09-07: 13:00:00

## 2015-09-07 MED ORDER — PROMETHAZINE HCL 25 MG/ML IJ SOLN
12.5000 mg | Freq: Once | INTRAMUSCULAR | Status: AC
  Administered 2015-09-07: 12.5 mg via INTRAVENOUS

## 2015-09-07 MED ORDER — SODIUM CHLORIDE 0.9 % IV SOLN
INTRAVENOUS | Status: DC
  Administered 2015-09-07: 13:00:00 via INTRAVENOUS

## 2015-09-07 MED ORDER — MEPERIDINE HCL 100 MG/ML IJ SOLN
INTRAMUSCULAR | Status: AC
  Filled 2015-09-07: qty 2

## 2015-09-07 MED ORDER — SODIUM CHLORIDE 0.9 % IJ SOLN
INTRAMUSCULAR | Status: AC
  Filled 2015-09-07: qty 3

## 2015-09-07 MED ORDER — PROMETHAZINE HCL 25 MG/ML IJ SOLN
INTRAMUSCULAR | Status: AC
  Filled 2015-09-07: qty 1

## 2015-09-07 MED ORDER — PROMETHAZINE HCL 25 MG/ML IJ SOLN: 25.0000 mg | Freq: Once | INTRAMUSCULAR | Status: DC

## 2015-09-07 MED ORDER — MEPERIDINE HCL 100 MG/ML IJ SOLN
INTRAMUSCULAR | Status: DC | PRN
  Administered 2015-09-07: 50 mg via INTRAVENOUS
  Administered 2015-09-07: 25 mg via INTRAVENOUS

## 2015-09-07 MED ORDER — ONDANSETRON HCL 4 MG/2ML IJ SOLN
INTRAMUSCULAR | Status: DC | PRN
  Administered 2015-09-07: 4 mg via INTRAVENOUS

## 2015-09-07 NOTE — Interval H&P Note (Signed)
History and Physical Interval Note:  09/07/2015 12:56 PM  Howard Sawyer  has presented today for surgery, with the diagnosis of history of polyps  The various methods of treatment have been discussed with the patient and family. After consideration of risks, benefits and other options for treatment, the patient has consented to  Procedure(s) with comments: COLONOSCOPY (N/A) - 130  as a surgical intervention .  The patient's history has been reviewed, patient examined, no change in status, stable for surgery.  I have reviewed the patient's chart and labs.  Questions were answered to the patient's satisfaction.     Howard Sawyer  No change early interval surveillance colonoscopy per plan.  The risks, benefits, limitations, alternatives and imponderables have been reviewed with the patient. Questions have been answered. All parties are agreeable.

## 2015-09-07 NOTE — Discharge Instructions (Signed)
Colonoscopy Discharge Instructions  Read the instructions outlined below and refer to this sheet in the next few weeks. These discharge instructions provide you with general information on caring for yourself after you leave the hospital. Your doctor may also give you specific instructions. While your treatment has been planned according to the most current medical practices available, unavoidable complications occasionally occur. If you have any problems or questions after discharge, call Dr. Gala Romney at [phone]. ACTIVITY  You may resume your regular activity, but move at a slower pace for the next 24 hours.   Take frequent rest periods for the next 24 hours.   Walking will help get rid of the air and reduce the bloated feeling in your belly (abdomen).   No driving for 24 hours (because of the medicine (anesthesia) used during the test).    Do not sign any important legal documents or operate any machinery for 24 hours (because of the anesthesia used during the test).  NUTRITION  Drink plenty of fluids.   You may resume your normal diet as instructed by your doctor.   Begin with a light meal and progress to your normal diet. Heavy or fried foods are harder to digest and may make you feel sick to your stomach (nauseated).   Avoid alcoholic beverages for 24 hours or as instructed.  MEDICATIONS  You may resume your normal medications unless your doctor tells you otherwise.  WHAT YOU CAN EXPECT TODAY  Some feelings of bloating in the abdomen.   Passage of more gas than usual.   Spotting of blood in your stool or on the toilet paper.  IF YOU HAD POLYPS REMOVED DURING THE COLONOSCOPY:  No aspirin products for 7 days or as instructed.   No alcohol for 7 days or as instructed.   Eat a soft diet for the next 24 hours.  FINDING OUT THE RESULTS OF YOUR TEST Not all test results are available during your visit. If your test results are not back during the visit, make an appointment  with your caregiver to find out the results. Do not assume everything is normal if you have not heard from your caregiver or the medical facility. It is important for you to follow up on all of your test results.  SEEK IMMEDIATE MEDICAL ATTENTION IF:  You have more than a spotting of blood in your stool.   Your belly is swollen (abdominal distention).   You are nauseated or vomiting.   You have a temperature over 101.   You have abdominal pain or discomfort that is severe or gets worse throughout the day.     Polyp and diverticulosis information provided  Further recommendations to follow pending review of pathology report  Colon Polyps Polyps are lumps of extra tissue growing inside the body. Polyps can grow in the large intestine (colon). Most colon polyps are noncancerous (benign). However, some colon polyps can become cancerous over time. Polyps that are larger than a pea may be harmful. To be safe, caregivers remove and test all polyps. CAUSES  Polyps form when mutations in the genes cause your cells to grow and divide even though no more tissue is needed. RISK FACTORS There are a number of risk factors that can increase your chances of getting colon polyps. They include:  Being older than 50 years.  Family history of colon polyps or colon cancer.  Long-term colon diseases, such as colitis or Crohn disease.  Being overweight.  Smoking.  Being inactive.  Drinking too  much alcohol. SYMPTOMS  Most small polyps do not cause symptoms. If symptoms are present, they may include:  Blood in the stool. The stool may look dark red or black.  Constipation or diarrhea that lasts longer than 1 week. DIAGNOSIS People often do not know they have polyps until their caregiver finds them during a regular checkup. Your caregiver can use 4 tests to check for polyps:  Digital rectal exam. The caregiver wears gloves and feels inside the rectum. This test would find polyps only in the  rectum.  Barium enema. The caregiver puts a liquid called barium into your rectum before taking X-rays of your colon. Barium makes your colon look white. Polyps are dark, so they are easy to see in the X-ray pictures.  Sigmoidoscopy. A thin, flexible tube (sigmoidoscope) is placed into your rectum. The sigmoidoscope has a light and tiny camera in it. The caregiver uses the sigmoidoscope to look at the last third of your colon.  Colonoscopy. This test is like sigmoidoscopy, but the caregiver looks at the entire colon. This is the most common method for finding and removing polyps. TREATMENT  Any polyps will be removed during a sigmoidoscopy or colonoscopy. The polyps are then tested for cancer. PREVENTION  To help lower your risk of getting more colon polyps:  Eat plenty of fruits and vegetables. Avoid eating fatty foods.  Do not smoke.  Avoid drinking alcohol.  Exercise every day.  Lose weight if recommended by your caregiver.  Eat plenty of calcium and folate. Foods that are rich in calcium include milk, cheese, and broccoli. Foods that are rich in folate include chickpeas, kidney beans, and spinach. HOME CARE INSTRUCTIONS Keep all follow-up appointments as directed by your caregiver. You may need periodic exams to check for polyps. SEEK MEDICAL CARE IF: You notice bleeding during a bowel movement.  Diverticulosis Diverticulosis is the condition that develops when small pouches (diverticula) form in the wall of your colon. Your colon, or large intestine, is where water is absorbed and stool is formed. The pouches form when the inside layer of your colon pushes through weak spots in the outer layers of your colon. CAUSES  No one knows exactly what causes diverticulosis. RISK FACTORS  Being older than 45. Your risk for this condition increases with age. Diverticulosis is rare in people younger than 40 years. By age 45, almost everyone has it.  Eating a low-fiber diet.  Being  frequently constipated.  Being overweight.  Not getting enough exercise.  Smoking.  Taking over-the-counter pain medicines, like aspirin and ibuprofen. SYMPTOMS  Most people with diverticulosis do not have symptoms. DIAGNOSIS  Because diverticulosis often has no symptoms, health care providers often discover the condition during an exam for other colon problems. In many cases, a health care provider will diagnose diverticulosis while using a flexible scope to examine the colon (colonoscopy). TREATMENT  If you have never developed an infection related to diverticulosis, you may not need treatment. If you have had an infection before, treatment may include:  Eating more fruits, vegetables, and grains.  Taking a fiber supplement.  Taking a live bacteria supplement (probiotic).  Taking medicine to relax your colon. HOME CARE INSTRUCTIONS   Drink at least 6-8 glasses of water each day to prevent constipation.  Try not to strain when you have a bowel movement.  Keep all follow-up appointments. If you have had an infection before:  Increase the fiber in your diet as directed by your health care provider or dietitian.  Take a dietary fiber supplement if your health care provider approves.  Only take medicines as directed by your health care provider. SEEK MEDICAL CARE IF:   You have abdominal pain.  You have bloating.  You have cramps.  You have not gone to the bathroom in 3 days. SEEK IMMEDIATE MEDICAL CARE IF:   Your pain gets worse.  Yourbloating becomes very bad.  You have a fever or chills, and your symptoms suddenly get worse.  You begin vomiting.  You have bowel movements that are bloody or black. MAKE SURE YOU:  Understand these instructions.  Will watch your condition.  Will get help right away if you are not doing well or get worse.

## 2015-09-07 NOTE — Op Note (Signed)
[facility] [address] [place], [address]   COLONOSCOPY PROCEDURE REPORT  PATIENT: Howard, Sawyer  MR#: [id number] BIRTHDATE: Apr 02, 1941 , 74  yrs. old GENDER: male ENDOSCOPIST: R.  Garfield Cornea, MD FACP [facility] REFERRED BY:Roy Willey Blade, M.D. PROCEDURE DATE:  September 22, 2015 PROCEDURE:   Colonoscopy with biopsy INDICATIONS:History of multiple colonic adenomas removed earlier this year;  surveillance examination. MEDICATIONS: Versed 5 mg IV and Demerol 75 mg IV in divided doses. Phenergan 12.5 mg IV.  Zofran 4 mg IV. ASA CLASS:       Class II  CONSENT: The risks, benefits, alternatives and imponderables including but not limited to bleeding, perforation as well as the possibility of a missed lesion have been reviewed.  The potential for biopsy, lesion removal, etc. have also been discussed. Questions have been answered.  All parties agreeable.  Please see the history and physical in the medical record for more information.  DESCRIPTION OF PROCEDURE:   After the risks benefits and alternatives of the procedure were thoroughly explained, informed consent was obtained.  The digital rectal exam revealed no abnormalities of the rectum.   The EC-[id number] ([id number])  endoscope was introduced through the anus and advanced to the   . No adverse events experienced.   The quality of the prep was     The instrument was then slowly withdrawn as the colon was fully examined. Estimated blood loss is zero unless otherwise noted in this procedure report.    Rectal mucosa seen well on?"face. Could not retroflex because the cable on the up down colonoscope knob broke.       Retroflexion was not performed. .  Pancolonic diverticula present. (1) diminutive polyp in the base of the thumb; otherwise, the remainder clot because appear normal. There was one diminutive rectal polyp in the rectum at 3 cm in from the anal verge. Both these polyps were cold biopsy/removal. When I was in the  cecum and was starting to withdraw, the up down cable broke which made the exam more difficult coming out. I was still able to survey the colon adequately. I did not identify any residual polyp  Withdrawal time=13 minutes 0 seconds.  The scope was withdrawn and the procedure from prior piecemeal polypectomy. COMPLICATIONS: There were no immediate complications.  ENDOSCOPIC IMPRESSION: Diminutive Rectal and colonic polyps removed as described above . Pancolonic diverticulosis.  RECOMMENDATIONS: Follow-up pathology.  eSigned:  R. Garfield Cornea, MD [facility] 2015-09-22 1:39 PM   cc:  CPT CODES: ICD CODES:  The ICD and CPT codes recommended by this software are interpretations from the data that the clinical staff has captured with the software.  The verification of the translation of this report to the ICD and CPT codes and modifiers is the sole responsibility of the health care institution and practicing physician where this report was generated.  [place]. will not be held responsible for the validity of the ICD and CPT codes included on this report.  AMA assumes no liability for data contained or not contained herein. CPT is a Designer, television/film set of the [employer].

## 2015-09-07 NOTE — H&P (View-Only) (Signed)
Primary Care Physician:  Asencion Noble, MD Primary Gastroenterologist:  Dr. Gala Romney  Pre-Procedure History & Physical: HPI:  Howard Sawyer is a 74 y.o. male here to set up a surveillance colonoscopy. Patient has had multiple colonoscopies  - the last one being in March of this year. He had multiple colonic adenomas removed. A flat ascending colon polyp which was removed in piecemeal fashion. No high-grade dysplasia was seen on pathology. He has no GI symptoms at this time. Given the multiplicity of polyps and piecemeal removal earlier this year, it is appropriate to bring him back for follow-up colonoscopy at this time the  Past Medical History  Diagnosis Date  . Hypertension   . Hyperlipidemia   . Prostate ca 2010  . Colon polyps     tubular adenoma  . Diverticulosis     Past Surgical History  Procedure Laterality Date  . Colonoscopy  03/08/2007    Serrated adenoma rectal polyp   . Colonoscopy   12/02/2003     Left sided diverticula/ Pedunculated polyp left colon resected as described above  . Prostatectomy    . Colonoscopy  02/16/2012    RMR:  Multiple rectal and colonic polyps-removed and/or ablated as described above.   . Colonoscopy N/A 03/16/2015    Dr.Rourk- multiple colonic polyps, colonic diverticulosis, redundant colon bx= tubular adenoma    Prior to Admission medications   Medication Sig Start Date End Date Taking? Authorizing Provider  aspirin EC 81 MG tablet Take 81 mg by mouth daily.   Yes Historical Provider, MD  fish oil-omega-3 fatty acids 1000 MG capsule Take 3 g by mouth daily.    Yes Historical Provider, MD  losartan (COZAAR) 100 MG tablet  12/30/14  Yes Historical Provider, MD  niacin 500 MG CR capsule Take 1,000 mg by mouth at bedtime.    Historical Provider, MD  polyethylene glycol-electrolytes (NULYTELY/GOLYTELY) 420 G solution Take 4,000 mLs by mouth once. Patient not taking: Reported on 08/18/2015 02/25/15   Carlis Stable, NP    Allergies as of 08/18/2015    . (No Known Allergies)    Family History  Problem Relation Age of Onset  . Colon cancer Neg Hx     Social History   Social History  . Marital Status: Married    Spouse Name: N/A  . Number of Children: 0  . Years of Education: N/A   Occupational History  . retired; [employer]    Social History Main Topics  . Smoking status: Former Smoker -- 0.50 packs/day for 40 years    Types: Cigarettes    Quit date: 02/04/2012  . Smokeless tobacco: Current User    Types: Chew     Comment: quit about 5-6 years  . Alcohol Use: 14.4 oz/week    24 Cans of beer per week     Comment: one case in a week  . Drug Use: No  . Sexual Activity: Not on file   Other Topics Concern  . Not on file   Social History Narrative    Review of Systems: See HPI, otherwise negative ROS  Physical Exam: BP 165/82 mmHg  Pulse 81  Temp(Src) 98.6 F (37 C)  Ht 5\' 8"  (1.727 m)  Wt 189 lb 9.6 oz (86.002 kg)  BMI 28.84 kg/m2 General:   Alert,   pleasant and cooperative in NAD Skin:  Intact without significant lesions or rashes. Eyes:  Sclera clear, no icterus.   Conjunctiva pink. Ears:  Normal auditory acuity. Nose:  No deformity, discharge,  or lesions. Mouth:  No deformity or lesions. Neck:  Supple; no masses or thyromegaly. No significant cervical adenopathy. Lungs:  Clear throughout to auscultation.   No wheezes, crackles, or rhonchi. No acute distress. Heart:  Regular rate and rhythm; no murmurs, clicks, rubs,  or gallops. Abdomen: Non-distended, normal bowel sounds.  Soft and nontender without appreciable mass or hepatosplenomegaly.  Pulses:  Normal pulses noted. Extremities:  Without clubbing or edema.  Impression:  Pleasant 74 year old male with a history of multiple colonic polyps removed over time. Multiple colonic polyps removed earlier this year  With an ascending colon lesion removed in piecemeal fashion. He is due for early surveillance colonoscopy at this time. Discussed the reasoning  behind this approach at length with him today.  Recommendations:  Surveillance colonoscopy in the near future. Split preparation. We'll augment conscious sedation with phenergan 25 mg IV prior to the procedure. Further recommendations to follow.The risks, benefits, limitations, alternatives and imponderables have been reviewed with the patient. Questions have been answered. All parties are agreeable.      Notice: This dictation was prepared with Dragon dictation along with smaller phrase technology. Any transcriptional errors that result from this process are unintentional and may not be corrected upon review.

## 2015-09-10 ENCOUNTER — Encounter (HOSPITAL_COMMUNITY): Payer: Self-pay | Admitting: Internal Medicine

## 2015-09-11 ENCOUNTER — Encounter: Payer: Self-pay | Admitting: Internal Medicine

## 2015-09-15 DIAGNOSIS — Z23 Encounter for immunization: Secondary | ICD-10-CM | POA: Diagnosis not present

## 2016-02-01 DIAGNOSIS — Z125 Encounter for screening for malignant neoplasm of prostate: Secondary | ICD-10-CM | POA: Diagnosis not present

## 2016-02-01 DIAGNOSIS — Z79899 Other long term (current) drug therapy: Secondary | ICD-10-CM | POA: Diagnosis not present

## 2016-02-01 DIAGNOSIS — E785 Hyperlipidemia, unspecified: Secondary | ICD-10-CM | POA: Diagnosis not present

## 2016-02-01 DIAGNOSIS — I10 Essential (primary) hypertension: Secondary | ICD-10-CM | POA: Diagnosis not present

## 2016-02-01 DIAGNOSIS — E119 Type 2 diabetes mellitus without complications: Secondary | ICD-10-CM | POA: Diagnosis not present

## 2016-02-08 DIAGNOSIS — E1129 Type 2 diabetes mellitus with other diabetic kidney complication: Secondary | ICD-10-CM | POA: Diagnosis not present

## 2016-02-08 DIAGNOSIS — Z8546 Personal history of malignant neoplasm of prostate: Secondary | ICD-10-CM | POA: Diagnosis not present

## 2016-02-08 DIAGNOSIS — R001 Bradycardia, unspecified: Secondary | ICD-10-CM | POA: Diagnosis not present

## 2016-02-08 DIAGNOSIS — Z6830 Body mass index (BMI) 30.0-30.9, adult: Secondary | ICD-10-CM | POA: Diagnosis not present

## 2016-08-03 DIAGNOSIS — E119 Type 2 diabetes mellitus without complications: Secondary | ICD-10-CM | POA: Diagnosis not present

## 2016-08-03 DIAGNOSIS — Z1159 Encounter for screening for other viral diseases: Secondary | ICD-10-CM | POA: Diagnosis not present

## 2017-03-22 ENCOUNTER — Emergency Department (HOSPITAL_COMMUNITY)
Admission: EM | Admit: 2017-03-22 | Discharge: 2017-03-22 | Disposition: A | Discharge status: Home / Self Care | Payer: Medicare Other | Attending: Emergency Medicine | Admitting: Emergency Medicine

## 2017-03-22 DIAGNOSIS — W19XXXA Unspecified fall, initial encounter: Secondary | ICD-10-CM

## 2017-03-22 DIAGNOSIS — Z7982 Long term (current) use of aspirin: Secondary | ICD-10-CM | POA: Diagnosis not present

## 2017-03-22 DIAGNOSIS — Z79899 Other long term (current) drug therapy: Secondary | ICD-10-CM | POA: Diagnosis not present

## 2017-03-22 DIAGNOSIS — F17220 Nicotine dependence, chewing tobacco, uncomplicated: Secondary | ICD-10-CM | POA: Diagnosis not present

## 2017-03-22 DIAGNOSIS — S0181XA Laceration without foreign body of other part of head, initial encounter: Secondary | ICD-10-CM

## 2017-03-22 DIAGNOSIS — Y999 Unspecified external cause status: Secondary | ICD-10-CM | POA: Insufficient documentation

## 2017-03-22 DIAGNOSIS — W108XXA Fall (on) (from) other stairs and steps, initial encounter: Secondary | ICD-10-CM | POA: Insufficient documentation

## 2017-03-22 DIAGNOSIS — Y9301 Activity, walking, marching and hiking: Secondary | ICD-10-CM | POA: Diagnosis not present

## 2017-03-22 DIAGNOSIS — S01112A Laceration without foreign body of left eyelid and periocular area, initial encounter: Secondary | ICD-10-CM | POA: Insufficient documentation

## 2017-03-22 DIAGNOSIS — Y929 Unspecified place or not applicable: Secondary | ICD-10-CM | POA: Insufficient documentation

## 2017-03-22 DIAGNOSIS — I10 Essential (primary) hypertension: Secondary | ICD-10-CM | POA: Insufficient documentation

## 2017-03-22 MED ORDER — BACITRACIN ZINC 500 UNIT/GM EX OINT
1.0000 "application " | TOPICAL_OINTMENT | Freq: Two times a day (BID) | CUTANEOUS | Status: DC
  Administered 2017-03-22: 1 via TOPICAL

## 2017-03-22 MED ORDER — LIDOCAINE HCL (PF) 1 % IJ SOLN: 5.0000 mL | Freq: Once | INTRAMUSCULAR | Status: DC

## 2017-03-22 MED ORDER — BACITRACIN ZINC 500 UNIT/GM EX OINT: 1.0000 "application " | TOPICAL_OINTMENT | Freq: Three times a day (TID) | CUTANEOUS | 1 refills | Status: DC

## 2017-03-22 MED ORDER — BACITRACIN ZINC 500 UNIT/GM EX OINT
TOPICAL_OINTMENT | CUTANEOUS | Status: AC
  Administered 2017-03-22: 1 via TOPICAL
  Filled 2017-03-22: qty 0.9

## 2017-03-22 MED ORDER — LIDOCAINE-EPINEPHRINE-TETRACAINE (LET) SOLUTION
3.0000 mL | Freq: Once | NASAL | Status: AC
  Administered 2017-03-22: 3 mL via TOPICAL
  Filled 2017-03-22: qty 3

## 2017-03-22 NOTE — ED Provider Notes (Signed)
[facility] DEPT Provider Note   CSN: [id number] Arrival date & time: 03/22/17  0018     History   Chief Complaint Chief Complaint  Patient presents with  . Facial Laceration    HPI SCOT SHIRAISHI is a 76 y.o. male.  Howard Sawyer is a 76 y.o. Male who presents to the ED after ensure been fall while walking his dog today. Patient reports he was walking his dog when he pulled on him causing him to fall landing on a concrete paver on the left side of his face. He complains of a laceration there. He denies loss of consciousness or changes to his vision. He denies anticoagulation use. He denies neck or back pain. He reports Tdap is up to date.    The history is provided by the patient, medical records and the spouse. No language interpreter was used.    Past Medical History:  Diagnosis Date  . Colon polyps    tubular adenoma  . Diverticulosis   . Hyperlipidemia   . Hypertension   . Prostate CA [facility]) 2010    Patient Active Problem List   Diagnosis Date Noted  . History of colonic polyps   . Diverticulosis of colon without hemorrhage   . Adenomatous colon polyp 02/10/2012    Past Surgical History:  Procedure Laterality Date  . COLONOSCOPY  03/08/2007   Serrated adenoma rectal polyp   . COLONOSCOPY   12/02/2003    Left sided diverticula/ Pedunculated polyp left colon resected as described above  . COLONOSCOPY  02/16/2012   RMR:  Multiple rectal and colonic polyps-removed and/or ablated as described above.   . COLONOSCOPY N/A 03/16/2015   Dr.Rourk- multiple colonic polyps, colonic diverticulosis, redundant colon bx= tubular adenoma  . COLONOSCOPY N/A 09/07/2015   Procedure: COLONOSCOPY;  Surgeon: Daneil Dolin, MD;  Location: [facility]ITE;  Service: Endoscopy;  Laterality: N/A;  130   . PROSTATECTOMY         Home Medications    Prior to Admission medications   Medication Sig Start Date End Date Taking? Authorizing Provider  aspirin EC 81 MG tablet Take  81 mg by mouth daily.    Historical Provider, MD  bacitracin ointment Apply 1 application topically 3 (three) times daily. 03/22/17   Waynetta Pean, PA-C  fish oil-omega-3 fatty acids 1000 MG capsule Take 1 g by mouth daily.     Historical Provider, MD  losartan (COZAAR) 100 MG tablet Take 100 mg by mouth daily.  12/30/14   Historical Provider, MD  polyethylene glycol-electrolytes (TRILYTE) 420 G solution Take 4,000 mLs by mouth as directed. 08/18/15   Daneil Dolin, MD    Family History Family History  Problem Relation Age of Onset  . Colon cancer Neg Hx     Social History Social History  Substance Use Topics  . Smoking status: Former Smoker    Packs/day: 0.50    Years: 40.00    Types: Cigarettes    Quit date: 02/04/2012  . Smokeless tobacco: Current User    Types: Chew     Comment: quit about 5-6 years  . Alcohol use 14.4 oz/week    24 Cans of beer per week     Comment: one case in a week     Allergies   Patient has no known allergies.   Review of Systems Review of Systems  Constitutional: Negative for fever.  Eyes: Negative for pain and visual disturbance.  Musculoskeletal: Negative for back pain and neck  pain.  Skin: Positive for wound. Negative for rash.  Neurological: Negative for dizziness, syncope, weakness, light-headedness, numbness and headaches.     Physical Exam Updated Vital Signs BP (!) 156/82 (BP Location: Left Arm)   Pulse 74   Temp 98 F (36.7 C) (Oral)   Resp 18   SpO2 97%   Physical Exam  Constitutional: He is oriented to person, place, and time. He appears well-developed and well-nourished. No distress.  HENT:  Head: Normocephalic and atraumatic.  Right Ear: External ear normal.  Left Ear: External ear normal.  Nose: Nose normal.  Mouth/Throat: Oropharynx is clear and moist.  Laceration noted beneath his left eye. Slight ecchymosis noted over his eyebrow. EOMs are intact. No palpated facial bone tenderness.  Eyes: EOM are normal. Pupils  are equal, round, and reactive to light. Right eye exhibits no discharge. Left eye exhibits no discharge.  Vision is grossly intact. EOMs are intact. No sign of entrapment.  Neck: Normal range of motion. Neck supple.  Cardiovascular: Normal rate, regular rhythm and intact distal pulses.   Pulmonary/Chest: Effort normal. No respiratory distress.  Abdominal: Soft. There is no tenderness.  Musculoskeletal: He exhibits no tenderness or deformity.  Lymphadenopathy:    He has no cervical adenopathy.  Neurological: He is alert and oriented to person, place, and time. No cranial nerve deficit or sensory deficit. He exhibits normal muscle tone. Coordination normal.  Patient is alert and oriented 3. Cranial nerves are intact. Speech is clear and coherent.  Skin: Skin is warm and dry. Capillary refill takes less than 2 seconds. No rash noted. He is not diaphoretic. No erythema. No pallor.  Psychiatric: He has a normal mood and affect. His behavior is normal.  Nursing note and vitals reviewed.        ED Treatments / Results  Labs (all labs ordered are listed, but only abnormal results are displayed) Labs Reviewed - No data to display  EKG  EKG Interpretation None       Radiology No results found.  Procedures .Marland KitchenLaceration Repair Date/Time: 03/22/2017 12:46 AM Performed by: Waynetta Pean Authorized by: Waynetta Pean   Consent:    Consent obtained:  Verbal   Consent given by:  Patient   Risks discussed:  Infection, poor cosmetic result and need for additional repair Anesthesia (see MAR for exact dosages):    Anesthesia method:  Topical application   Topical anesthetic:  LET Laceration details:    Location:  Face   Face location:  L lower eyelid   Extent:  Superficial   Length (cm):  2   Depth (mm):  2 Repair type:    Repair type:  Intermediate Pre-procedure details:    Preparation:  Patient was prepped and draped in usual sterile fashion Exploration:    Hemostasis  achieved with:  LET and direct pressure   Wound exploration: entire depth of wound probed and visualized     Wound extent: no foreign bodies/material noted     Contaminated: no   Treatment:    Area cleansed with:  Saline   Amount of cleaning:  Standard   Irrigation solution:  Sterile saline Skin repair:    Repair method:  Sutures   Suture size:  6-0   Suture material:  Prolene   Suture technique:  Simple interrupted   Number of sutures:  4 Approximation:    Approximation:  Close Post-procedure details:    Dressing:  Antibiotic ointment and non-adherent dressing   Patient tolerance of procedure:  Tolerated  well, no immediate complications   (including critical care time)  Medications Ordered in ED Medications  lidocaine (PF) (XYLOCAINE) 1 % injection 5 mL (not administered)  bacitracin ointment 1 application (not administered)  bacitracin 500 UNIT/GM ointment (not administered)  lidocaine-EPINEPHrine-tetracaine (LET) solution (3 mLs Topical Given 03/22/17 0033)     Initial Impression / Assessment and Plan / ED Course  I have reviewed the triage vital signs and the nursing notes.  Pertinent labs & imaging results that were available during my care of the patient were reviewed by me and considered in my medical decision making (see chart for details).     This  is a 76 y.o. Male who presents to the ED after ensure been fall while walking his dog today. Patient reports he was walking his dog when he pulled on him causing him to fall landing on a concrete paver on the left side of his face. He complains of a laceration there. He denies loss of consciousness or changes to his vision. He denies anticoagulation use. He denies neck or back pain. He reports Tdap is up to date.  On exam the patient is afebrile nontoxic appearing. He has a 2 cm laceration noted beneath his left eye. See picture as above. No facial bone tenderness to exam. No evidence of entrapment. No neck or back tenderness  to palpation. He has no focal neurological deficits. Tetanus is up-to-date. Wound was repaired by myself and tolerated well by the patient. Four 6-0 proline sutures were placed. I discussed wound care and precautions. Stitches out in 5-7 days. Return precautions discussed. I advised the patient to follow-up with their primary care provider this week. I advised the patient to return to the emergency department with new or worsening symptoms or new concerns. The patient verbalized understanding and agreement with plan.     This patient was discussed with and evaluated by Dr. Tomi Bamberger who agrees with assessment and plan.   Final Clinical Impressions(s) / ED Diagnoses   Final diagnoses:  Facial laceration, initial encounter  Fall, initial encounter    New Prescriptions New Prescriptions   BACITRACIN OINTMENT    Apply 1 application topically 3 (three) times daily.     Waynetta Pean, PA-C 03/22/17 4801    Rolland Porter, MD 03/22/17 [phone]

## 2017-03-22 NOTE — Discharge Instructions (Signed)
You have 4 sutures in your face. They should be removed in about 5-7 days.

## 2017-03-22 NOTE — ED Provider Notes (Signed)
Pt was taking his large dog, Airedale, weighing 95 pounds outside to use the bathroom and the dog was going down 3 outside steps and saw something and took off and pulled the patient down and he hit his left face on some landscaping cement pavers. No LOC, denies pain.  Pt is alert and cooperative. He has about 3 small superficial abrasions on the bridge of his nose. He has a laceration of his left lower eyelid that is linear and horizontally oriented with a lot of bruising and swelling of his upper and lower eyelids.    Medical screening examination/treatment/procedure(s) were conducted as a shared visit with non-physician practitioner(s) and myself.  I personally evaluated the patient during the encounter.    Rolland Porter, MD, Barbette Or, MD 03/22/17 [phone]

## 2017-03-22 NOTE — ED Triage Notes (Signed)
Pt states his dog knocked him down and pt fell down steps and hits concrete steps

## 2017-03-28 DIAGNOSIS — S01112D Laceration without foreign body of left eyelid and periocular area, subsequent encounter: Secondary | ICD-10-CM | POA: Diagnosis not present

## 2017-03-30 DIAGNOSIS — H40033 Anatomical narrow angle, bilateral: Secondary | ICD-10-CM | POA: Diagnosis not present

## 2017-03-30 DIAGNOSIS — H2513 Age-related nuclear cataract, bilateral: Secondary | ICD-10-CM | POA: Diagnosis not present

## 2017-08-04 DIAGNOSIS — R42 Dizziness and giddiness: Secondary | ICD-10-CM | POA: Diagnosis not present

## 2017-08-04 DIAGNOSIS — R030 Elevated blood-pressure reading, without diagnosis of hypertension: Secondary | ICD-10-CM | POA: Diagnosis not present

## 2017-08-05 ENCOUNTER — Emergency Department (HOSPITAL_COMMUNITY)
Admission: EM | Admit: 2017-08-05 | Discharge: 2017-08-05 | Disposition: A | Discharge status: Home / Self Care | Payer: Medicare Other | Attending: Emergency Medicine | Admitting: Emergency Medicine

## 2017-08-05 ENCOUNTER — Encounter (HOSPITAL_COMMUNITY): Payer: Self-pay

## 2017-08-05 ENCOUNTER — Emergency Department (HOSPITAL_COMMUNITY): Payer: Medicare Other

## 2017-08-05 DIAGNOSIS — Z7982 Long term (current) use of aspirin: Secondary | ICD-10-CM | POA: Insufficient documentation

## 2017-08-05 DIAGNOSIS — Z87891 Personal history of nicotine dependence: Secondary | ICD-10-CM | POA: Diagnosis not present

## 2017-08-05 DIAGNOSIS — R05 Cough: Secondary | ICD-10-CM | POA: Diagnosis not present

## 2017-08-05 DIAGNOSIS — I10 Essential (primary) hypertension: Secondary | ICD-10-CM | POA: Insufficient documentation

## 2017-08-05 DIAGNOSIS — J209 Acute bronchitis, unspecified: Secondary | ICD-10-CM | POA: Diagnosis not present

## 2017-08-05 DIAGNOSIS — Z79899 Other long term (current) drug therapy: Secondary | ICD-10-CM | POA: Diagnosis not present

## 2017-08-05 DIAGNOSIS — J40 Bronchitis, not specified as acute or chronic: Secondary | ICD-10-CM | POA: Insufficient documentation

## 2017-08-05 DIAGNOSIS — Z8546 Personal history of malignant neoplasm of prostate: Secondary | ICD-10-CM | POA: Diagnosis not present

## 2017-08-05 LAB — COMPREHENSIVE METABOLIC PANEL
ALK PHOS: 52 U/L (ref 38–126)
ALT: 31 U/L (ref 17–63)
AST: 26 U/L (ref 15–41)
Albumin: 4.2 g/dL (ref 3.5–5.0)
Anion gap: 10 (ref 5–15)
BILIRUBIN TOTAL: 0.5 mg/dL (ref 0.3–1.2)
BUN: 16 mg/dL (ref 6–20)
CO2: 23 mmol/L (ref 22–32)
Calcium: 9 mg/dL (ref 8.9–10.3)
Chloride: 105 mmol/L (ref 101–111)
Creatinine, Ser: 1.17 mg/dL (ref 0.61–1.24)
GFR calc Af Amer: >60 mL/min (ref >60)
GFR calc non Af Amer: 60 mL/min — ABNORMAL LOW (ref >60)
GLUCOSE: 102 mg/dL — AB (ref 65–99)
POTASSIUM: 3.3 mmol/L — AB (ref 3.5–5.1)
Sodium: 138 mmol/L (ref 135–145)
TOTAL PROTEIN: 7 g/dL (ref 6.5–8.1)

## 2017-08-05 LAB — CBC WITH DIFFERENTIAL/PLATELET
BASOS ABS: 0 10*3/uL (ref 0.0–0.1)
Basophils Relative: 1 %
EOS PCT: 1 %
Eosinophils Absolute: 0.1 10*3/uL (ref 0.0–0.7)
HCT: 41.4 % (ref 39.0–52.0)
Hemoglobin: 14.5 g/dL (ref 13.0–17.0)
LYMPHS PCT: 22 %
Lymphs Abs: 1.3 10*3/uL (ref 0.7–4.0)
MCH: 33.1 pg (ref 26.0–34.0)
MCHC: 35 g/dL (ref 30.0–36.0)
MCV: 94.5 fL (ref 78.0–100.0)
MONO ABS: 0.7 10*3/uL (ref 0.1–1.0)
Monocytes Relative: 12 %
Neutro Abs: 3.7 10*3/uL (ref 1.7–7.7)
Neutrophils Relative %: 64 %
Platelets: 198 10*3/uL (ref 150–400)
RBC: 4.38 MIL/uL (ref 4.22–5.81)
RDW: 12.8 % (ref 11.5–15.5)
WBC: 5.8 10*3/uL (ref 4.0–10.5)

## 2017-08-05 LAB — ETHANOL: Alcohol, Ethyl (B): 32 mg/dL — ABNORMAL HIGH (ref <5)

## 2017-08-05 MED ORDER — CLONIDINE HCL 0.1 MG PO TABS
0.1000 mg | ORAL_TABLET | Freq: Once | ORAL | Status: AC
  Administered 2017-08-05: 0.1 mg via ORAL
  Filled 2017-08-05: qty 1

## 2017-08-05 MED ORDER — AZITHROMYCIN 250 MG PO TABS: ORAL_TABLET | ORAL | 0 refills | Status: DC

## 2017-08-05 NOTE — Discharge Instructions (Signed)
Drink plenty of fluids. Try to cut back on the amount of beer you drink every day. Take the antibiotic until gone. Recheck if you get a fever, struggle to breathe, get chest pain. Otherwise, see Dr Willey Blade this coming week and he will decide if you need to be on blood pressure medication or not.

## 2017-08-05 NOTE — ED Provider Notes (Signed)
[facility] DEPT Provider Note   CSN: [id number] Arrival date & time: 08/05/17  0002  Time seen 12:50 AM   History   Chief Complaint Chief Complaint  Patient presents with  . Hypertension    HPI Howard Sawyer is a 76 y.o. male.  HPI   patient states he was told couple years ago that he needed to start blood pressure medication. He states he talked to his doctor about exercising and he did not have to be treated. He reports however he has not been exercising lately. He states however today he did exercise, he states he took his dog for a walk at 8:00 this morning for 30 minutes. He then did errands and this evening he was mowing grass on a riding lawnmower for about an hour and states it was hot and humid outside and he was sweating a lot. He had to quit because it got dark. He then came in the house and was straightening up the house. He reports drinking 5 beers over the 4 hours and he was sitting down to watch TV and drink some more beers. He states his blood pressure was normal last week when he checked it and gave an example of 120/84. He states over the last couple days his blood pressure has been gradually getting higher. He has been having lightheadedness for the past several days. So tonight he took his blood pressure and it was 170/104, he then took it again and it was 196/108. He called EMS who did a CBG on him which was 77. His wife then brought into the ED. He denies headache, chest pain, shortness of breath, or numbness or tingling of his extremities. He denies any known family history of hypertension.  Patient denies being on blood pressure medication however he is on losartan. He states his A1C was 5.7 and his doctor put him on the losartan to protect his kidneys in case he develops diabetes.  PCP Asencion Noble, MD   Past Medical History:  Diagnosis Date  . Colon polyps    tubular adenoma  . Diverticulosis   . Hyperlipidemia   . Hypertension   . Prostate CA [facility]) 2010      Patient Active Problem List   Diagnosis Date Noted  . History of colonic polyps   . Diverticulosis of colon without hemorrhage   . Adenomatous colon polyp 02/10/2012    Past Surgical History:  Procedure Laterality Date  . COLONOSCOPY  03/08/2007   Serrated adenoma rectal polyp   . COLONOSCOPY   12/02/2003    Left sided diverticula/ Pedunculated polyp left colon resected as described above  . COLONOSCOPY  02/16/2012   RMR:  Multiple rectal and colonic polyps-removed and/or ablated as described above.   . COLONOSCOPY N/A 03/16/2015   Dr.Rourk- multiple colonic polyps, colonic diverticulosis, redundant colon bx= tubular adenoma  . COLONOSCOPY N/A 09/07/2015   Procedure: COLONOSCOPY;  Surgeon: Daneil Dolin, MD;  Location: [facility] SUITE;  Service: Endoscopy;  Laterality: N/A;  130   . PROSTATECTOMY         Home Medications    Prior to Admission medications   Medication Sig Start Date End Date Taking? Authorizing Provider  aspirin EC 81 MG tablet Take 81 mg by mouth daily.   Yes [provider]  fish oil-omega-3 fatty acids 1000 MG capsule Take 1 g by mouth daily.    Yes [provider]  losartan (COZAAR) 100 MG tablet Take 100 mg by mouth daily.  12/30/14  Yes [provider]  azithromycin (ZITHROMAX) 250 MG tablet Take 2 po the first day then once a day for the next 4 days. 08/05/17   Rolland Porter, MD  bacitracin ointment Apply 1 application topically 3 (three) times daily. 03/22/17   Waynetta Pean, PA-C  polyethylene glycol-electrolytes (TRILYTE) 420 G solution Take 4,000 mLs by mouth as directed. 08/18/15   Rourk, Cristopher Estimable, MD    Family History Family History  Problem Relation Age of Onset  . Colon cancer Neg Hx     Social History Social History  Substance Use Topics  . Smoking status: Former Smoker    Packs/day: 0.50    Years: 40.00    Types: Cigarettes    Quit date: 02/04/2012  . Smokeless tobacco: Current User    Types: Chew     Comment:  quit about 5-6 years  . Alcohol use 14.4 oz/week    24 Cans of beer per week     Comment: one case in a week  [place]ired [employer] he drinks at least 5 beers a day Lives with spouse   Allergies   Codeine   Review of Systems Review of Systems  All other systems reviewed and are negative.    Physical Exam Updated Vital Signs BP (!) 171/88   Pulse 73   Temp 98.6 F (37 C) (Oral)   Resp 12   Ht 5\' 7"  (1.702 m)   Wt 87.5 kg (193 lb)   SpO2 94%   BMI 30.23 kg/m   Vital signs normal except for hypertension   Physical Exam  Constitutional: He is oriented to person, place, and time. He appears well-developed and well-nourished.  Non-toxic appearance. He does not appear ill. No distress.  HENT:  Head: Normocephalic and atraumatic.  Right Ear: External ear normal.  Left Ear: External ear normal.  Nose: Nose normal. No mucosal edema or rhinorrhea.  Mouth/Throat: Oropharynx is clear and moist and mucous membranes are normal. No dental abscesses or uvula swelling.  Eyes: Pupils are equal, round, and reactive to light. Conjunctivae and EOM are normal.  Neck: Normal range of motion and full passive range of motion without pain. Neck supple.  Cardiovascular: Normal rate, regular rhythm and normal heart sounds.  Exam reveals no gallop and no friction rub.   No murmur heard. Pulmonary/Chest: Effort normal and breath sounds normal. No respiratory distress. He has no wheezes. He has no rhonchi. He has no rales. He exhibits no tenderness and no crepitus.  Abdominal: Soft. Normal appearance and bowel sounds are normal. He exhibits no distension. There is no tenderness. There is no rebound and no guarding.  Musculoskeletal: Normal range of motion. He exhibits no edema or tenderness.  Moves all extremities well.   Neurological: He is alert and oriented to person, place, and time. He has normal strength. No cranial nerve deficit.  Skin: Skin is warm, dry and intact. No rash noted. No erythema. No  pallor.  Psychiatric: He has a normal mood and affect. His speech is normal and behavior is normal. His mood appears not anxious.  Nursing note and vitals reviewed.    ED Treatments / Results  Labs (all labs ordered are listed, but only abnormal results are displayed) Results for orders placed or performed during the hospital encounter of 08/05/17  Comprehensive metabolic panel  Result Value Ref Range   Sodium 138 135 - 145 mmol/L   Potassium 3.3 (L) 3.5 - 5.1 mmol/L   Chloride 105 101 - 111  mmol/L   CO2 23 22 - 32 mmol/L   Glucose, Bld 102 (H) 65 - 99 mg/dL   BUN 16 6 - 20 mg/dL   Creatinine, Ser 1.17 0.61 - 1.24 mg/dL   Calcium 9.0 8.9 - 10.3 mg/dL   Total Protein 7.0 6.5 - 8.1 g/dL   Albumin 4.2 3.5 - 5.0 g/dL   AST 26 15 - 41 U/L   ALT 31 17 - 63 U/L   Alkaline Phosphatase 52 38 - 126 U/L   Total Bilirubin 0.5 0.3 - 1.2 mg/dL   GFR calc non Af Amer 60 (L) >60 mL/min   GFR calc Af Amer >60 >60 mL/min   Anion gap 10 5 - 15  CBC with Differential  Result Value Ref Range   WBC 5.8 4.0 - 10.5 K/uL   RBC 4.38 4.22 - 5.81 MIL/uL   Hemoglobin 14.5 13.0 - 17.0 g/dL   HCT 41.4 39.0 - 52.0 %   MCV 94.5 78.0 - 100.0 fL   MCH 33.1 26.0 - 34.0 pg   MCHC 35.0 30.0 - 36.0 g/dL   RDW 12.8 11.5 - 15.5 %   Platelets 198 150 - 400 K/uL   Neutrophils Relative % 64 %   Neutro Abs 3.7 1.7 - 7.7 K/uL   Lymphocytes Relative 22 %   Lymphs Abs 1.3 0.7 - 4.0 K/uL   Monocytes Relative 12 %   Monocytes Absolute 0.7 0.1 - 1.0 K/uL   Eosinophils Relative 1 %   Eosinophils Absolute 0.1 0.0 - 0.7 K/uL   Basophils Relative 1 %   Basophils Absolute 0.0 0.0 - 0.1 K/uL  Ethanol  Result Value Ref Range   Alcohol, Ethyl (B) 32 (H) <5 mg/dL   Laboratory interpretation all normal except alcohol ingestion, mild hypokalemia    EKG  EKG Interpretation  Date/Time:  Saturday August 05 2017 00:14:41 EDT Ventricular Rate:  81 PR Interval:    QRS Duration: 101 QT Interval:  381 QTC  Calculation: 443 R Axis:   60 Text Interpretation:  Sinus rhythm RSR' in V1 or V2, right VCD or RVH U waves present No significant change since last tracing 18 Mar 2011 Confirmed by Rolland Porter [phone]) on 08/05/2017 12:21:41 AM       Radiology Dg Chest 2 View  Result Date: 08/05/2017 CLINICAL DATA:  Cough for 1 week EXAM: CHEST  2 VIEW COMPARISON:  03/18/2011 FINDINGS: Similar appearance of scarring at the lingula. No acute consolidation or pleural effusion. Stable cardiomediastinal silhouette. No pneumothorax. Degenerative changes of the spine. IMPRESSION: No active cardiopulmonary disease. Electronically Signed   By: Donavan Foil M.D.   On: 08/05/2017 03:06    Procedures Procedures (including critical care time)  Medications Ordered in ED Medications  cloNIDine (CATAPRES) tablet 0.1 mg (0.1 mg Oral Given 08/05/17 0254)     Initial Impression / Assessment and Plan / ED Course  I have reviewed the triage vital signs and the nursing notes.  Pertinent labs & imaging results that were available during my care of the patient were reviewed by me and considered in my medical decision making (see chart for details).    Patient's last laboratory testing in our system was April 2012. Basic labs were done tonight. I did not immediately Treat his blood pressure, I'm going to let him relax and see if it will resolve on its own.  Recheck at 2:35 AM blood pressure still in the 170 range. We were going over his laboratory testing and patient  states "you mean you don't find any evidence of infection in my body". I asked him what he was talking about and he now reports that everybody in church including his wife have been having a cough. He states he has been having a cough and coughs up green stuff in the morning. This had not been relayed to myself or nursing staff when he came in. He denies taking over-the-counter cough medication. He was given clonidine 0.1 mg orally and chest x-ray was  ordered.  Recheck at 4:15 AM pressure is now 161/89 patient states he's feeling better. We discussed his x-ray and lab results, the computer had shut down last time I was trying to tell him his lab results. He wants me to send him home on blood pressure medication. However I discussed with him his blood pressure was totally normal last week when he checked it. He is sick this week with bronchitis although I have not heard him coughing during his ED visit. He was advised to take the antibiotics and follow-up with his primary care doctor this week and then if he then has  Persistently elevated blood pressure his primary care doctor can decide to treat it at that time.  Final Clinical Impressions(s) / ED Diagnoses   Final diagnoses:  Hypertension, unspecified type  Bronchitis    New Prescriptions New Prescriptions   AZITHROMYCIN (ZITHROMAX) 250 MG TABLET    Take 2 po the first day then once a day for the next 4 days.    Plan discharge  Rolland Porter, MD, Barbette Or, MD 08/05/17 [phone]

## 2017-08-05 NOTE — ED Triage Notes (Signed)
Pt states he had come in from mowing the grass, sat down to relax and had a couple of beers and then felt a little dizzy.  Pt states he checked his blood pressure and it was 171/104, checked again and it was 196/108.   Pt denies pain

## 2017-08-15 DIAGNOSIS — Z79899 Other long term (current) drug therapy: Secondary | ICD-10-CM | POA: Diagnosis not present

## 2017-08-15 DIAGNOSIS — E1129 Type 2 diabetes mellitus with other diabetic kidney complication: Secondary | ICD-10-CM | POA: Diagnosis not present

## 2017-08-15 DIAGNOSIS — E785 Hyperlipidemia, unspecified: Secondary | ICD-10-CM | POA: Diagnosis not present

## 2017-08-24 DIAGNOSIS — Z23 Encounter for immunization: Secondary | ICD-10-CM | POA: Diagnosis not present

## 2017-08-24 DIAGNOSIS — I10 Essential (primary) hypertension: Secondary | ICD-10-CM | POA: Diagnosis not present

## 2017-08-24 DIAGNOSIS — E1129 Type 2 diabetes mellitus with other diabetic kidney complication: Secondary | ICD-10-CM | POA: Diagnosis not present

## 2017-09-22 DIAGNOSIS — I10 Essential (primary) hypertension: Secondary | ICD-10-CM | POA: Diagnosis not present

## 2017-10-30 DIAGNOSIS — I10 Essential (primary) hypertension: Secondary | ICD-10-CM | POA: Diagnosis not present

## 2018-01-15 DIAGNOSIS — J069 Acute upper respiratory infection, unspecified: Secondary | ICD-10-CM | POA: Diagnosis not present

## 2018-01-15 DIAGNOSIS — Z6832 Body mass index (BMI) 32.0-32.9, adult: Secondary | ICD-10-CM | POA: Diagnosis not present

## 2018-03-05 DIAGNOSIS — E1129 Type 2 diabetes mellitus with other diabetic kidney complication: Secondary | ICD-10-CM | POA: Diagnosis not present

## 2018-03-05 DIAGNOSIS — Z6827 Body mass index (BMI) 27.0-27.9, adult: Secondary | ICD-10-CM | POA: Diagnosis not present

## 2018-03-05 DIAGNOSIS — Z8545 Personal history of malignant neoplasm of unspecified male genital organ: Secondary | ICD-10-CM | POA: Diagnosis not present

## 2018-03-05 DIAGNOSIS — I10 Essential (primary) hypertension: Secondary | ICD-10-CM | POA: Diagnosis not present

## 2018-07-25 ENCOUNTER — Encounter: Payer: Self-pay | Admitting: Internal Medicine

## 2020-09-16 DIAGNOSIS — U071 COVID-19: Secondary | ICD-10-CM | POA: Diagnosis not present

## 2020-09-16 DIAGNOSIS — R0689 Other abnormalities of breathing: Secondary | ICD-10-CM | POA: Diagnosis not present

## 2020-09-16 DIAGNOSIS — R0902 Hypoxemia: Secondary | ICD-10-CM | POA: Diagnosis not present

## 2020-09-16 DIAGNOSIS — R0602 Shortness of breath: Secondary | ICD-10-CM | POA: Diagnosis not present

## 2020-09-17 ENCOUNTER — Inpatient Hospital Stay (HOSPITAL_COMMUNITY)
Admission: EM | Admit: 2020-09-17 | Discharge: 2020-09-28 | DRG: 177 | Attending: Internal Medicine | Admitting: Internal Medicine

## 2020-09-17 ENCOUNTER — Emergency Department (HOSPITAL_COMMUNITY)

## 2020-09-17 ENCOUNTER — Encounter (HOSPITAL_COMMUNITY): Payer: Self-pay

## 2020-09-17 ENCOUNTER — Other Ambulatory Visit: Payer: Self-pay

## 2020-09-17 DIAGNOSIS — E440 Moderate protein-calorie malnutrition: Secondary | ICD-10-CM | POA: Diagnosis present

## 2020-09-17 DIAGNOSIS — J1282 Pneumonia due to coronavirus disease 2019: Secondary | ICD-10-CM | POA: Diagnosis present

## 2020-09-17 DIAGNOSIS — T380X5A Adverse effect of glucocorticoids and synthetic analogues, initial encounter: Secondary | ICD-10-CM | POA: Diagnosis present

## 2020-09-17 DIAGNOSIS — U071 COVID-19: Secondary | ICD-10-CM | POA: Diagnosis present

## 2020-09-17 DIAGNOSIS — Z7982 Long term (current) use of aspirin: Secondary | ICD-10-CM

## 2020-09-17 DIAGNOSIS — R0602 Shortness of breath: Secondary | ICD-10-CM | POA: Diagnosis not present

## 2020-09-17 DIAGNOSIS — R439 Unspecified disturbances of smell and taste: Secondary | ICD-10-CM | POA: Diagnosis present

## 2020-09-17 DIAGNOSIS — R7401 Elevation of levels of liver transaminase levels: Secondary | ICD-10-CM | POA: Diagnosis present

## 2020-09-17 DIAGNOSIS — E871 Hypo-osmolality and hyponatremia: Secondary | ICD-10-CM | POA: Diagnosis present

## 2020-09-17 DIAGNOSIS — F17220 Nicotine dependence, chewing tobacco, uncomplicated: Secondary | ICD-10-CM | POA: Diagnosis present

## 2020-09-17 DIAGNOSIS — E162 Hypoglycemia, unspecified: Secondary | ICD-10-CM | POA: Diagnosis not present

## 2020-09-17 DIAGNOSIS — Z8546 Personal history of malignant neoplasm of prostate: Secondary | ICD-10-CM | POA: Diagnosis not present

## 2020-09-17 DIAGNOSIS — R197 Diarrhea, unspecified: Secondary | ICD-10-CM | POA: Diagnosis present

## 2020-09-17 DIAGNOSIS — I10 Essential (primary) hypertension: Secondary | ICD-10-CM | POA: Diagnosis present

## 2020-09-17 DIAGNOSIS — R5381 Other malaise: Secondary | ICD-10-CM | POA: Diagnosis present

## 2020-09-17 DIAGNOSIS — J9601 Acute respiratory failure with hypoxia: Secondary | ICD-10-CM | POA: Diagnosis present

## 2020-09-17 DIAGNOSIS — Z6824 Body mass index (BMI) 24.0-24.9, adult: Secondary | ICD-10-CM

## 2020-09-17 DIAGNOSIS — R739 Hyperglycemia, unspecified: Secondary | ICD-10-CM | POA: Diagnosis present

## 2020-09-17 LAB — CBC WITH DIFFERENTIAL/PLATELET
Abs Immature Granulocytes: 0.02 10*3/uL (ref 0.00–0.07)
Basophils Absolute: 0 10*3/uL (ref 0.0–0.1)
Basophils Relative: 0 %
Eosinophils Absolute: 0 10*3/uL (ref 0.0–0.5)
Eosinophils Relative: 0 %
HCT: 42.6 % (ref 39.0–52.0)
Hemoglobin: 14.9 g/dL (ref 13.0–17.0)
Immature Granulocytes: 1 %
Lymphocytes Relative: 15 %
Lymphs Abs: 0.5 10*3/uL — ABNORMAL LOW (ref 0.7–4.0)
MCH: 31.1 pg (ref 26.0–34.0)
MCHC: 35 g/dL (ref 30.0–36.0)
MCV: 88.9 fL (ref 80.0–100.0)
Monocytes Absolute: 0.4 10*3/uL (ref 0.1–1.0)
Monocytes Relative: 13 %
Neutro Abs: 2.4 10*3/uL (ref 1.7–7.7)
Neutrophils Relative %: 71 %
Platelets: 163 10*3/uL (ref 150–400)
RBC: 4.79 MIL/uL (ref 4.22–5.81)
RDW: 13 % (ref 11.5–15.5)
WBC: 3.4 10*3/uL — ABNORMAL LOW (ref 4.0–10.5)
nRBC: 0 % (ref 0.0–0.2)

## 2020-09-17 LAB — COMPREHENSIVE METABOLIC PANEL
ALT: 204 U/L — ABNORMAL HIGH (ref 0–44)
AST: 228 U/L — ABNORMAL HIGH (ref 15–41)
Albumin: 3.2 g/dL — ABNORMAL LOW (ref 3.5–5.0)
Alkaline Phosphatase: 63 U/L (ref 38–126)
Anion gap: 8 (ref 5–15)
BUN: 14 mg/dL (ref 8–23)
CO2: 27 mmol/L (ref 22–32)
Calcium: 7.8 mg/dL — ABNORMAL LOW (ref 8.9–10.3)
Chloride: 93 mmol/L — ABNORMAL LOW (ref 98–111)
Creatinine, Ser: 1.07 mg/dL (ref 0.61–1.24)
GFR, Estimated: >60 mL/min (ref >60)
Glucose, Bld: 153 mg/dL — ABNORMAL HIGH (ref 70–99)
Potassium: 3.5 mmol/L (ref 3.5–5.1)
Sodium: 128 mmol/L — ABNORMAL LOW (ref 135–145)
Total Bilirubin: 0.9 mg/dL (ref 0.3–1.2)
Total Protein: 6 g/dL — ABNORMAL LOW (ref 6.5–8.1)

## 2020-09-17 LAB — HEPATITIS PANEL, ACUTE
HCV Ab: NONREACTIVE
Hep A IgM: NONREACTIVE
Hep B C IgM: NONREACTIVE
Hepatitis B Surface Ag: NONREACTIVE

## 2020-09-17 LAB — TRIGLYCERIDES: Triglycerides: 131 mg/dL (ref <150)

## 2020-09-17 LAB — LACTIC ACID, PLASMA: Lactic Acid, Venous: 1.8 mmol/L (ref 0.5–1.9)

## 2020-09-17 LAB — LACTATE DEHYDROGENASE: LDH: 441 U/L — ABNORMAL HIGH (ref 98–192)

## 2020-09-17 LAB — PROCALCITONIN: Procalcitonin: 0.14 ng/mL

## 2020-09-17 LAB — RESPIRATORY PANEL BY RT PCR (FLU A&B, COVID)
Influenza A by PCR: NEGATIVE
Influenza B by PCR: NEGATIVE
SARS Coronavirus 2 by RT PCR: POSITIVE — AB

## 2020-09-17 LAB — FERRITIN: Ferritin: 3007 ng/mL — ABNORMAL HIGH (ref 24–336)

## 2020-09-17 LAB — C-REACTIVE PROTEIN: CRP: 2.9 mg/dL — ABNORMAL HIGH (ref <1.0)

## 2020-09-17 LAB — FIBRINOGEN: Fibrinogen: 505 mg/dL — ABNORMAL HIGH (ref 210–475)

## 2020-09-17 LAB — D-DIMER, QUANTITATIVE: D-Dimer, Quant: 0.83 ug/mL-FEU — ABNORMAL HIGH (ref 0.00–0.50)

## 2020-09-17 MED ORDER — ACETAMINOPHEN 325 MG PO TABS
650.0000 mg | ORAL_TABLET | Freq: Four times a day (QID) | ORAL | Status: DC | PRN
  Administered 2020-09-24 – 2020-09-25 (×2): 650 mg via ORAL
  Filled 2020-09-17 (×2): qty 2

## 2020-09-17 MED ORDER — SODIUM CHLORIDE 0.9 % IV SOLN
100.0000 mg | INTRAVENOUS | Status: AC
  Administered 2020-09-17 (×2): 100 mg via INTRAVENOUS
  Filled 2020-09-17 (×2): qty 20

## 2020-09-17 MED ORDER — DEXAMETHASONE SODIUM PHOSPHATE 10 MG/ML IJ SOLN
6.0000 mg | INTRAMUSCULAR | Status: DC
  Administered 2020-09-18: 6 mg via INTRAVENOUS
  Filled 2020-09-17: qty 1

## 2020-09-17 MED ORDER — ASCORBIC ACID 500 MG PO TABS
500.0000 mg | ORAL_TABLET | Freq: Every day | ORAL | Status: DC
  Administered 2020-09-17 – 2020-09-28 (×12): 500 mg via ORAL
  Filled 2020-09-17 (×12): qty 1

## 2020-09-17 MED ORDER — ENOXAPARIN SODIUM 40 MG/0.4ML ~~LOC~~ SOLN
40.0000 mg | SUBCUTANEOUS | Status: DC
  Administered 2020-09-18 – 2020-09-28 (×11): 40 mg via SUBCUTANEOUS
  Filled 2020-09-17 (×15): qty 0.4

## 2020-09-17 MED ORDER — GUAIFENESIN-DM 100-10 MG/5ML PO SYRP
10.0000 mL | ORAL_SOLUTION | ORAL | Status: DC | PRN
  Administered 2020-09-24 – 2020-09-25 (×3): 10 mL via ORAL
  Filled 2020-09-17 (×3): qty 10

## 2020-09-17 MED ORDER — ZINC SULFATE 220 (50 ZN) MG PO CAPS
220.0000 mg | ORAL_CAPSULE | Freq: Every day | ORAL | Status: DC
  Administered 2020-09-17 – 2020-09-28 (×12): 220 mg via ORAL
  Filled 2020-09-17 (×11): qty 1

## 2020-09-17 MED ORDER — LOSARTAN POTASSIUM 50 MG PO TABS
100.0000 mg | ORAL_TABLET | Freq: Every day | ORAL | Status: DC
  Administered 2020-09-17 – 2020-09-28 (×12): 100 mg via ORAL
  Filled 2020-09-17 (×12): qty 2

## 2020-09-17 MED ORDER — POLYETHYLENE GLYCOL 3350 17 G PO PACK
17.0000 g | PACK | Freq: Every day | ORAL | Status: DC | PRN
  Administered 2020-09-25: 17 g via ORAL
  Filled 2020-09-17: qty 1

## 2020-09-17 MED ORDER — SODIUM CHLORIDE 0.9 % IV SOLN: INTRAVENOUS | Status: DC

## 2020-09-17 MED ORDER — ALBUTEROL SULFATE HFA 108 (90 BASE) MCG/ACT IN AERS
2.0000 | INHALATION_SPRAY | Freq: Four times a day (QID) | RESPIRATORY_TRACT | Status: DC
  Administered 2020-09-17: 2 via RESPIRATORY_TRACT
  Filled 2020-09-17: qty 6.7

## 2020-09-17 MED ORDER — DEXAMETHASONE SODIUM PHOSPHATE 10 MG/ML IJ SOLN
10.0000 mg | Freq: Once | INTRAMUSCULAR | Status: AC
  Administered 2020-09-17: 10 mg via INTRAVENOUS
  Filled 2020-09-17: qty 1

## 2020-09-17 MED ORDER — ASPIRIN EC 81 MG PO TBEC
81.0000 mg | DELAYED_RELEASE_TABLET | Freq: Every day | ORAL | Status: DC
  Administered 2020-09-17 – 2020-09-28 (×12): 81 mg via ORAL
  Filled 2020-09-17 (×13): qty 1

## 2020-09-17 MED ORDER — ONDANSETRON HCL 4 MG PO TABS: 4.0000 mg | ORAL_TABLET | Freq: Four times a day (QID) | ORAL | Status: DC | PRN

## 2020-09-17 MED ORDER — ALBUTEROL SULFATE HFA 108 (90 BASE) MCG/ACT IN AERS
2.0000 | INHALATION_SPRAY | Freq: Four times a day (QID) | RESPIRATORY_TRACT | Status: DC | PRN
  Administered 2020-09-24 (×2): 2 via RESPIRATORY_TRACT
  Filled 2020-09-17 (×2): qty 6.7

## 2020-09-17 MED ORDER — ONDANSETRON HCL 4 MG/2ML IJ SOLN: 4.0000 mg | Freq: Four times a day (QID) | INTRAMUSCULAR | Status: DC | PRN

## 2020-09-17 MED ORDER — SODIUM CHLORIDE 0.9 % IV SOLN
100.0000 mg | Freq: Every day | INTRAVENOUS | Status: AC
  Administered 2020-09-18 – 2020-09-21 (×4): 100 mg via INTRAVENOUS
  Filled 2020-09-17 (×4): qty 20

## 2020-09-17 NOTE — ED Notes (Addendum)
Date and time results received: 09/17/20 0253 (use smartphrase ".now" to insert current time)  Test: Covid Critical Value: Positive  Name of Provider Notified: Clearence Ped, MD  Orders Received? Or Actions Taken?:

## 2020-09-17 NOTE — H&P (Signed)
TRH H&P    Patient Demographics:    Howard Sawyer, is a 79 y.o. male  MRN: [id number]  DOB - 15-Mar-1941  Admit Date - 09/17/2020  Referring MD/NP/PA: [place]  Outpatient Primary MD for the patient is Asencion Noble, MD  Patient coming from: [place]  Chief complaint-generalized weakness   HPI:    Howard Sawyer  is a 79 y.o. male With history of prostate cancer, hypertension, hyperlipidemia, and diverticulosis presents to the ED with a chief complaint of weakness.  Patient reports that he has had generalized weakness, no appetite, shortness of breath, cough, clear sputum, and fever for 1 week.  Symptoms of getting progressively worse over 1 week.  He is not vaccinated for Covid.  He also reports an associated loss of taste, but no loss of smell.  Patient denies chest pains, body aches.  He did have associated diarrhea at the onset of symptoms.  Patient reports he has been taking Tylenol for fever and it has helped.  Patient does not smoke and has no known lung disease.  In the ED Temperature 98.5, heart rate 56, respiratory rate 16, blood pressure 104/86 Patient was saturating low 80s on room air, improved to mid 90s on 4 L nasal cannula CHEM panel reveals a hyponatremia of 128, hyperglycemia 153 hypoalbuminemia at 3.2, AST is elevated at 228, ALT is elevated at 2 4 Hematology shows a leukopenia at 3.4 Inflammatory markers show an LDH of 441, triglycerides of 131, ferritin 3007, CRP 2.9, lactic acid 1.8, pro-Cal 0.14, fibrinogen 505, D-dimer 0.83 Chest x-ray shows bilateral airspace infiltrates likely infectious or inflammatory Covid positive Remdesivir Decadron started in the ED Admission requested for further management of respiratory failure and Covid pneumonia    Review of systems:    Review of Systems  Constitutional: Positive for chills, fever and malaise/fatigue.  HENT: Negative for congestion, ear  pain, sinus pain, sore throat and tinnitus.   Eyes: Negative for pain, discharge and redness.  Respiratory: Positive for cough, sputum production and shortness of breath. Negative for hemoptysis and wheezing.   Cardiovascular: Negative for chest pain, palpitations and leg swelling.  Gastrointestinal: Positive for diarrhea. Negative for abdominal pain, constipation, nausea and vomiting.  Genitourinary: Negative for dysuria, frequency and urgency.  Musculoskeletal: Negative for back pain, falls and myalgias.  Skin: Negative for itching and rash.  Neurological: Positive for weakness. Negative for focal weakness.  Psychiatric/Behavioral: Negative for substance abuse.     All other systems reviewed and are negative.    Past History of the following :    Past Medical History:  Diagnosis Date  . Colon polyps    tubular adenoma  . Diverticulosis   . Hyperlipidemia   . Hypertension   . Prostate CA ([place]) 2010      Past Surgical History:  Procedure Laterality Date  . COLONOSCOPY  03/08/2007   Serrated adenoma rectal polyp   . COLONOSCOPY   12/02/2003    Left sided diverticula/ Pedunculated polyp left colon resected as described above  . COLONOSCOPY  02/16/2012   RMR:  Multiple rectal and colonic polyps-removed and/or ablated as described above.   . COLONOSCOPY N/A 03/16/2015   Dr.Rourk- multiple colonic polyps, colonic diverticulosis, redundant colon bx= tubular adenoma  . COLONOSCOPY N/A 09/07/2015   Procedure: COLONOSCOPY;  Surgeon: Daneil Dolin, MD;  Location: [facility];  Service: Endoscopy;  Laterality: N/A;  130   . PROSTATECTOMY        Social History:      Social History   Tobacco Use  . Smoking status: Former Smoker    Packs/day: 0.50    Years: 40.00    Pack years: 20.00    Types: Cigarettes    Quit date: 02/04/2012    Years since quitting: 8.6  . Smokeless tobacco: Current User    Types: Chew  . Tobacco comment: quit about 5-6 years  Substance Use Topics   . Alcohol use: Yes    Alcohol/week: 24.0 standard drinks    Types: 24 Cans of beer per week    Comment: one case in a week       Family History :     Family History  Problem Relation Age of Onset  . Colon cancer Neg Hx      Home Medications:   Prior to Admission medications   Medication Sig Start Date End Date Taking? Authorizing Provider  aspirin EC 81 MG tablet Take 81 mg by mouth daily.    [provider]  azithromycin (ZITHROMAX) 250 MG tablet Take 2 po the first day then once a day for the next 4 days. 08/05/17   Rolland Porter, MD  bacitracin ointment Apply 1 application topically 3 (three) times daily. 03/22/17   Waynetta Pean, PA-C  fish oil-omega-3 fatty acids 1000 MG capsule Take 1 g by mouth daily.     [provider]  losartan (COZAAR) 100 MG tablet Take 100 mg by mouth daily.  12/30/14   [provider]  polyethylene glycol-electrolytes (TRILYTE) 420 G solution Take 4,000 mLs by mouth as directed. 08/18/15   Rourk, Cristopher Estimable, MD     Allergies:     Allergies  Allergen Reactions  . Codeine      Physical Exam:   Vitals  Blood pressure 104/86, pulse (!) 56, temperature 98.5 F (36.9 C), temperature source Oral, resp. rate 16, height 5\' 8"  (1.727 m), weight 72.6 kg, SpO2 95 %.  1.  General: Lying supine in bed asleep no acute distress  2. Psychiatric: Mood and behavior normal for situation  3. Neurologic: Cranial nerves II through XII are grossly intact, no focal deficit on limited exam  4. HEENMT:  Head is atraumatic, normocephalic, pupils are reactive, neck is supple, trachea is midline, mucous membranes are moist, edentulous  5. Respiratory : Lungs are clear to auscultation bilaterally, diminished breath sounds in the lower lung fields bilaterally  6. Cardiovascular : Heart rate is normal, rhythm is regular, no murmurs rubs or gallops, no peripheral edema  7. Gastrointestinal:  Abdomen is soft, nondistended, nontender to  palpation  8. Skin:  No acute lesions on limited skin exam  9.Musculoskeletal:  No deformity or peripheral edema    Data Review:    CBC Recent Labs  Lab 09/17/20 0128  WBC 3.4*  HGB 14.9  HCT 42.6  PLT 163  MCV 88.9  MCH 31.1  MCHC 35.0  RDW 13.0  LYMPHSABS 0.5*  MONOABS 0.4  EOSABS 0.0  BASOSABS 0.0   ------------------------------------------------------------------------------------------------------------------  Results for orders placed or performed during the hospital encounter of  09/17/20 (from the past 48 hour(s))  Respiratory Panel by RT PCR (Flu A&B, Covid) - Nasopharyngeal Swab     Status: Abnormal   Collection Time: 09/17/20 12:19 AM   Specimen: Nasopharyngeal Swab  Result Value Ref Range   SARS Coronavirus 2 by RT PCR POSITIVE (A) NEGATIVE    Comment: RESULT CALLED TO, READ BACK BY AND VERIFIED WITH: T EASTER,RN@[id number]  09/17/20 MKELLY (NOTE) SARS-CoV-2 target nucleic acids are DETECTED.  SARS-CoV-2 RNA is generally detectable in upper respiratory specimens  during the acute phase of infection. Positive results are indicative of the presence of the identified virus, but do not rule out bacterial infection or co-infection with other pathogens not detected by the test. Clinical correlation with patient history and other diagnostic information is necessary to determine patient infection status. The expected result is Negative.  Fact Sheet for Patients:  PinkCheek.be  Fact Sheet for Healthcare Providers: GravelBags.it  This test is not yet approved or cleared by the [place] FDA and  has been authorized for detection and/or diagnosis of SARS-CoV-2 by FDA under an Emergency Use Authorization (EUA).  This EUA will remain in effect (meaning this test can be [place] ed) for the duration of  the COVID-19 declaration under Section 564(b)(1) of the Act, 21 U.S.C. section 360bbb-3(b)(1), unless the  authorization is terminated or revoked sooner.      Influenza A by PCR NEGATIVE NEGATIVE   Influenza B by PCR NEGATIVE NEGATIVE    Comment: (NOTE) The Xpert Xpress SARS-CoV-2/FLU/RSV assay is intended as an aid in  the diagnosis of influenza from Nasopharyngeal swab specimens and  should not be used as a sole basis for treatment. Nasal washings and  aspirates are unacceptable for Xpert Xpress SARS-CoV-2/FLU/RSV  testing.  Fact Sheet for Patients: PinkCheek.be  Fact Sheet for Healthcare Providers: GravelBags.it  This test is not yet approved or cleared by the [place] FDA and  has been authorized for detection and/or diagnosis of SARS-CoV-2 by  FDA under an Emergency Use Authorization (EUA). This EUA will remain  in effect (meaning this test can be used) for the duration of the  Covid-19 declaration under Section 564(b)(1) of the Act, 21  U.S.C. section 360bbb-3(b)(1), unless the authorization is  terminated or revoked. Performed at [facility], [address] [place], [place] [address]   Ferritin     Status: Abnormal   Collection Time: 09/17/20  1:18 AM  Result Value Ref Range   Ferritin 3,007 (H) 24 - 336 ng/mL    Comment: Performed at [facility], [address] [place], [place] [address]  Triglycerides     Status: None   Collection Time: 09/17/20  1:18 AM  Result Value Ref Range   Triglycerides 131 <150 mg/dL    Comment: Performed at [facility], [address] [place], [place] [address]  C-reactive protein     Status: Abnormal   Collection Time: 09/17/20  1:18 AM  Result Value Ref Range   CRP 2.9 (H) <1.0 mg/dL    Comment: Performed at [facility], [address], [place], [place] [address]  Lactic acid, plasma     Status: None   Collection Time: 09/17/20  1:28 AM  Result Value Ref Range   Lactic Acid, Venous 1.8 0.5 - 1.9 mmol/L    Comment: Performed at [facility], [address] [place],  [place] [address]  CBC WITH DIFFERENTIAL     Status: Abnormal   Collection Time: 09/17/20  1:28 AM  Result Value Ref  Range   WBC 3.4 (L) 4.0 - 10.5 K/uL   RBC 4.79 4.22 - 5.81 MIL/uL   Hemoglobin 14.9 13.0 - 17.0 g/dL   HCT 42.6 39 - 52 %   MCV 88.9 80.0 - 100.0 fL   MCH 31.1 26.0 - 34.0 pg   MCHC 35.0 30.0 - 36.0 g/dL   RDW 13.0 11.5 - 15.5 %   Platelets 163 150 - 400 K/uL   nRBC 0.0 0.0 - 0.2 %   Neutrophils Relative % 71 %   Neutro Abs 2.4 1.7 - 7.7 K/uL   Lymphocytes Relative 15 %   Lymphs Abs 0.5 (L) 0.7 - 4.0 K/uL   Monocytes Relative 13 %   Monocytes Absolute 0.4 0.1 - 1.0 K/uL   Eosinophils Relative 0 %   Eosinophils Absolute 0.0 0.0 - 0.5 K/uL   Basophils Relative 0 %   Basophils Absolute 0.0 0.0 - 0.1 K/uL   Immature Granulocytes 1 %   Abs Immature Granulocytes 0.02 0.00 - 0.07 K/uL    Comment: Performed at [facility], [address] [place], [place] [address]  Comprehensive metabolic panel     Status: Abnormal   Collection Time: 09/17/20  1:28 AM  Result Value Ref Range   Sodium 128 (L) 135 - 145 mmol/L   Potassium 3.5 3.5 - 5.1 mmol/L   Chloride 93 (L) 98 - 111 mmol/L   CO2 27 22 - 32 mmol/L   Glucose, Bld 153 (H) 70 - 99 mg/dL    Comment: Glucose reference range applies only to samples taken after fasting for at least 8 hours.   BUN 14 8 - 23 mg/dL   Creatinine, Ser 1.07 0.61 - 1.24 mg/dL   Calcium 7.8 (L) 8.9 - 10.3 mg/dL   Total Protein 6.0 (L) 6.5 - 8.1 g/dL   Albumin 3.2 (L) 3.5 - 5.0 g/dL   AST 228 (H) 15 - 41 U/L   ALT 204 (H) 0 - 44 U/L   Alkaline Phosphatase 63 38 - 126 U/L   Total Bilirubin 0.9 0.3 - 1.2 mg/dL   GFR, Estimated >60 >60 mL/min   Anion gap 8 5 - 15    Comment: Performed at [facility], [address], [place], [place] [address]  D-dimer, quantitative     Status: Abnormal   Collection Time: 09/17/20  1:28 AM  Result Value Ref Range   D-Dimer, Quant 0.83 (H) 0.00 - 0.50 ug/mL-FEU    Comment: (NOTE) At the manufacturer cut-off value of  0.5 g/mL FEU, this assay has a negative predictive value of 95-100%.This assay is intended for use in conjunction with a clinical pretest probability (PTP) assessment model to exclude pulmonary embolism (PE) and deep venous thrombosis (DVT) in outpatients suspected of PE or DVT. Results should be correlated with clinical presentation. Performed at [facility], [address] [place], [place] [address]   Procalcitonin     Status: None   Collection Time: 09/17/20  1:28 AM  Result Value Ref Range   Procalcitonin 0.14 ng/mL    Comment:        Interpretation: PCT (Procalcitonin) <= 0.5 ng/mL: Systemic infection (sepsis) is not likely. Local bacterial infection is possible. (NOTE)       Sepsis PCT Algorithm           Lower Respiratory Tract  Infection PCT Algorithm    ----------------------------     ----------------------------         PCT < 0.25 ng/mL                PCT < 0.10 ng/mL          Strongly encourage             Strongly discourage   discontinuation of antibiotics    initiation of antibiotics    ----------------------------     -----------------------------       PCT 0.25 - 0.50 ng/mL            PCT 0.10 - 0.25 ng/mL               OR       >80% decrease in PCT            Discourage initiation of                                            antibiotics      Encourage discontinuation           of antibiotics    ----------------------------     -----------------------------         PCT >= 0.50 ng/mL              PCT 0.26 - 0.50 ng/mL               AND        <80% decrease in PCT             Encourage initiation of                                             antibiotics       Encourage continuation           of antibiotics    ----------------------------     -----------------------------        PCT >= 0.50 ng/mL                  PCT > 0.50 ng/mL               AND         increase in PCT                  Strongly encourage                                       initiation of antibiotics    Strongly encourage escalation           of antibiotics                                     -----------------------------                                           PCT <= 0.25 ng/mL  OR                                        > 80% decrease in PCT                                      Discontinue / Do not initiate                                             antibiotics  Performed at [facility], [address] [place], [place] [address]   Lactate dehydrogenase     Status: Abnormal   Collection Time: 09/17/20  1:28 AM  Result Value Ref Range   LDH 441 (H) 98 - 192 U/L    Comment: Performed at [facility], [address], [place], [place] [address]  Fibrinogen     Status: Abnormal   Collection Time: 09/17/20  1:28 AM  Result Value Ref Range   Fibrinogen 505 (H) 210 - 475 mg/dL    Comment: Performed at [facility], [address], [place], [place] [address]    Chemistries  Recent Labs  Lab 09/17/20 0128  NA 128*  K 3.5  CL 93*  CO2 27  GLUCOSE 153*  BUN 14  CREATININE 1.07  CALCIUM 7.8*  AST 228*  ALT 204*  ALKPHOS 63  BILITOT 0.9   ------------------------------------------------------------------------------------------------------------------  ------------------------------------------------------------------------------------------------------------------ GFR: Estimated Creatinine Clearance: 55.9 mL/min (by C-G formula based on SCr of 1.07 mg/dL). Liver Function Tests: Recent Labs  Lab 09/17/20 0128  AST 228*  ALT 204*  ALKPHOS 63  BILITOT 0.9  PROT 6.0*  ALBUMIN 3.2*   No results for input(s): LIPASE, AMYLASE in the last 168 hours. No results for input(s): AMMONIA in the last 168 hours. Coagulation Profile: No results for input(s): INR, PROTIME in the last 168 hours. Cardiac Enzymes: No results for input(s): CKTOTAL, CKMB, CKMBINDEX, TROPONINI in the last  168 hours. BNP (last 3 results) No results for input(s): PROBNP in the last 8760 hours. HbA1C: No results for input(s): HGBA1C in the last 72 hours. CBG: No results for input(s): GLUCAP in the last 168 hours. Lipid Profile: Recent Labs    09/17/20 0118  TRIG 131   Thyroid Function Tests: No results for input(s): TSH, T4TOTAL, FREET4, T3FREE, THYROIDAB in the last 72 hours. Anemia Panel: Recent Labs    09/17/20 0118  FERRITIN 3,007*    --------------------------------------------------------------------------------------------------------------- Urine analysis:    Component Value Date/Time   COLORURINE YELLOW 03/16/2011 1111   APPEARANCEUR CLEAR 03/16/2011 1111   LABSPEC 1.015 03/16/2011 1111   PHURINE 6.5 03/16/2011 1111   GLUCOSEU NEGATIVE 03/16/2011 1111   HGBUR NEGATIVE 03/16/2011 1111   BILIRUBINUR NEGATIVE 03/16/2011 1111   KETONESUR 15 (A) 03/16/2011 1111   PROTEINUR NEGATIVE 03/16/2011 1111   UROBILINOGEN 0.2 03/16/2011 1111   NITRITE NEGATIVE 03/16/2011 1111   LEUKOCYTESUR  03/16/2011 1111    NEGATIVE MICROSCOPIC NOT DONE ON URINES WITH NEGATIVE PROTEIN, BLOOD, LEUKOCYTES, NITRITE, OR GLUCOSE <1000 mg/dL.      Imaging Results:    DG Chest Portable 1 View  Result Date: 09/17/2020 CLINICAL DATA:  COVID pneumonia EXAM: PORTABLE CHEST 1 VIEW COMPARISON:  08/05/2017 FINDINGS: Lung volumes  are small and pulmonary insufflation has diminished since prior examination. Pulmonary insufflation is symmetric. Extensive bilateral mid and lower lung zone airspace infiltrates are identified, likely infectious or inflammatory in nature in typical of those seen in COVID-19 pneumonia. No pneumothorax or pleural effusion. Cardiac size within normal limits. Pulmonary vascularity is normal. No acute bone abnormality. IMPRESSION: Extensive bilateral airspace infiltrates, likely infectious or inflammatory in nature. Electronically Signed   By: Fidela Salisbury MD   On: 09/17/2020 00:49     My personal review of EKG: Rhythm NSR, Rate 71 /min, QTc 432 ,no Acute ST changes   Assessment & Plan:    Active Problems:   Pneumonia due to COVID-19 virus   1. Acute respiratory failure with hypoxia 1. Secondary to Covid pneumonia 2. Continue 4 L nasal cannula, wean off as tolerated 3. Continue inhaler as needed 4. Monitor on telemetry 5. Check pulse ox with vitals 2. COVID-19 infection 1. Chest x-ray shows bilateral airspace infiltrates likely infectious or inflammatory 2. Covid positive 3. Lives at [facility], recent Covid spike 4. Remdesivir and Decadron started in the ER 5. Continue remdesivir and Decadron 6. Continue inhalers and antitussives 7. Inflammatory markers show an LDH of 441, triglycerides 131, ferritin 3007, CRP 2.9, lactic acid 1.8, pro-Cal 0.14, D-dimer 0.83, fibrinogen 505 8. Trend inflammatory markers 9. Transaminitis also with an AST of 228, ALT of 204 this could be due to Covid as well 3. Hyponatremia 1. Secondary to poor p.o. intake 2. Start gentle fluids 4. Protein calorie malnutrition 1. Encourage nutrient dense foods 5. Transaminitis 1. Likely secondary to Covid 2. Check hepatitis panel 3. Trend in the a.m. 6.    DVT Prophylaxis-   Lovenox - SCDs   AM Labs Ordered, also please review Full Orders  Family Communication: No family at bedside Code Status: Full code  Admission status: Inpatient :The appropriate admission status for this patient is INPATIENT. Inpatient status is judged to be reasonable and necessary in order to provide the required intensity of service to ensure the patient's safety. The patient's presenting symptoms, physical exam findings, and initial radiographic and laboratory data in the context of their chronic comorbidities is felt to place them at high risk for further clinical deterioration. Furthermore, it is not anticipated that the patient will be medically stable for discharge from the hospital within 2  midnights of admission. The following factors support the admission status of inpatient.     The patient's presenting symptoms include generalized weakness The worrisome physical exam findings include hypoxia requiring 4 L nasal cannula The initial radiographic and laboratory data are worrisome because of chest x-ray findings consistent with Covid pneumonia, Covid positive The chronic co-morbidities include prostate cancer, hypertension, hyperlipidemia, diverticulosis       * I certify that at the point of admission it is my clinical judgment that the patient will require inpatient hospital care spanning beyond 2 midnights from the point of admission due to high intensity of service, high risk for further deterioration and high frequency of surveillance required.*  Time spent in minutes : [place]

## 2020-09-17 NOTE — Progress Notes (Signed)
[facility] OF CARE NOTE   09/17/2020 10:27 AM   Howard Sawyer was seen and examined.  The H&P by the admitting provider, orders, imaging was reviewed.  Please see new orders.  Will continue to follow.  Incarcerated male with officer present at bedside.  Administration arranged for transfer to [facility] due to no beds available at [facility] and he is still holding in ED.  Continue current covid orders.  Pt remains stable to transfer to [facility].   Vitals:   09/17/20 0930 09/17/20 1000  BP:  118/66  Pulse: 66 64  Resp: (!) 30 (!) 27  Temp: 98.2 F (36.8 C)   SpO2: 90% 95%    Results for orders placed or performed during the hospital encounter of 09/17/20  Respiratory Panel by RT PCR (Flu A&B, Covid) - Nasopharyngeal Swab   Specimen: Nasopharyngeal Swab  Result Value Ref Range   SARS Coronavirus 2 by RT PCR POSITIVE (A) NEGATIVE   Influenza A by PCR NEGATIVE NEGATIVE   Influenza B by PCR NEGATIVE NEGATIVE  Blood Culture (routine x 2)   Specimen: BLOOD  Result Value Ref Range   Specimen Description BLOOD    Special Requests NONE    Culture      NO GROWTH < 12 HOURS Performed at [facility], [address] [place], [place] [address]    Report Status PENDING   Blood Culture (routine x 2)   Specimen: BLOOD  Result Value Ref Range   Specimen Description BLOOD RIGHT ANTECUBITAL    Special Requests      BOTTLES DRAWN AEROBIC AND ANAEROBIC Blood Culture adequate volume   Culture      NO GROWTH < 12 HOURS Performed at [facility], [address], [place], [place] [address]    Report Status PENDING   Lactic acid, plasma  Result Value Ref Range   Lactic Acid, Venous 1.8 0.5 - 1.9 mmol/L  CBC WITH DIFFERENTIAL  Result Value Ref Range   WBC 3.4 (L) 4.0 - 10.5 K/uL   RBC 4.79 4.22 - 5.81 MIL/uL   Hemoglobin 14.9 13.0 - 17.0 g/dL   HCT 42.6 39 - 52 %   MCV 88.9 80.0 - 100.0 fL   MCH 31.1 26.0 - 34.0 pg   MCHC 35.0 30.0 - 36.0 g/dL   RDW 13.0 11.5 - 15.5 %   Platelets 163 150 - 400 K/uL    nRBC 0.0 0.0 - 0.2 %   Neutrophils Relative % 71 %   Neutro Abs 2.4 1.7 - 7.7 K/uL   Lymphocytes Relative 15 %   Lymphs Abs 0.5 (L) 0.7 - 4.0 K/uL   Monocytes Relative 13 %   Monocytes Absolute 0.4 0.1 - 1.0 K/uL   Eosinophils Relative 0 %   Eosinophils Absolute 0.0 0.0 - 0.5 K/uL   Basophils Relative 0 %   Basophils Absolute 0.0 0.0 - 0.1 K/uL   Immature Granulocytes 1 %   Abs Immature Granulocytes 0.02 0.00 - 0.07 K/uL  Comprehensive metabolic panel  Result Value Ref Range   Sodium 128 (L) 135 - 145 mmol/L   Potassium 3.5 3.5 - 5.1 mmol/L   Chloride 93 (L) 98 - 111 mmol/L   CO2 27 22 - 32 mmol/L   Glucose, Bld 153 (H) 70 - 99 mg/dL   BUN 14 8 - 23 mg/dL   Creatinine, Ser 1.07 0.61 - 1.24 mg/dL   Calcium 7.8 (L) 8.9 - 10.3 mg/dL   Total Protein 6.0 (L) 6.5 - 8.1  g/dL   Albumin 3.2 (L) 3.5 - 5.0 g/dL   AST 228 (H) 15 - 41 U/L   ALT 204 (H) 0 - 44 U/L   Alkaline Phosphatase 63 38 - 126 U/L   Total Bilirubin 0.9 0.3 - 1.2 mg/dL   GFR, Estimated >60 >60 mL/min   Anion gap 8 5 - 15  D-dimer, quantitative  Result Value Ref Range   D-Dimer, Quant 0.83 (H) 0.00 - 0.50 ug/mL-FEU  Procalcitonin  Result Value Ref Range   Procalcitonin 0.14 ng/mL  Lactate dehydrogenase  Result Value Ref Range   LDH 441 (H) 98 - 192 U/L  Ferritin  Result Value Ref Range   Ferritin 3,007 (H) 24 - 336 ng/mL  Triglycerides  Result Value Ref Range   Triglycerides 131 <150 mg/dL  Fibrinogen  Result Value Ref Range   Fibrinogen 505 (H) 210 - 475 mg/dL  C-reactive protein  Result Value Ref Range   CRP 2.9 (H) <1.0 mg/dL   Murvin Natal, MD [facility]   09/17/2020 12:12 AM How to contact the [facility] Attending or Consulting provider [place] or covering provider during after hours 7P -7A, for this patient?  1. Check the care team in [facility] and look for a) attending/consulting TRH provider listed and b) the [facility] team listed 2. Log into [email or web address] and use [facility]'s universal password to access.  If you do not have the password, please contact the hospital operator. 3. Locate the [facility] provider you are looking for under [facility] and page to a number that you can be directly reached. 4. If you still have difficulty reaching the provider, please page the [facility] (Director on Call) for the Hospitalists listed on amion for assistance.

## 2020-09-17 NOTE — Plan of Care (Signed)
Patient remains on 4L n/c. O2 Sats 92%. Denies SoB or discomfort at this tijme, Safety precautions maintaned.

## 2020-09-17 NOTE — ED Notes (Signed)
Called [facility] for transport to [place] 

## 2020-09-17 NOTE — ED Triage Notes (Signed)
Pt here from [facility]. ot comes in with mild SOB , COVID + with jail's rapid test. Alert and oriented .  85% on room air. Increased with oxygen.

## 2020-09-17 NOTE — ED Provider Notes (Signed)
[facility] Provider Note   CSN: [id number] Arrival date & time: 09/17/20  0012     History Chief Complaint  Patient presents with  . Shortness of [place] is a 79 y.o. male.  From the [facility] jail secondary to positive Covid test and hypoxia.  Patient has been weak for a few days has had a cough and shortness of breath as well.  Was tested there was positive.  EMS picked him up his oxygen saturation was in the mid 80s requiring couple liters of oxygen.  No history of lung issues.  No nausea, vomiting.  Does have some diarrhea.  No body aches.  No known fevers.  No chills.    Shortness of Breath      Past Medical History:  Diagnosis Date  . Colon polyps    tubular adenoma  . Diverticulosis   . Hyperlipidemia   . Hypertension   . Prostate CA [facility]) 2010    Patient Active Problem List   Diagnosis Date Noted  . History of colonic polyps   . Diverticulosis of colon without hemorrhage   . Adenomatous colon polyp 02/10/2012    Past Surgical History:  Procedure Laterality Date  . COLONOSCOPY  03/08/2007   Serrated adenoma rectal polyp   . COLONOSCOPY   12/02/2003    Left sided diverticula/ Pedunculated polyp left colon resected as described above  . COLONOSCOPY  02/16/2012   RMR:  Multiple rectal and colonic polyps-removed and/or ablated as described above.   . COLONOSCOPY N/A 03/16/2015   Dr.Rourk- multiple colonic polyps, colonic diverticulosis, redundant colon bx= tubular adenoma  . COLONOSCOPY N/A 09/07/2015   Procedure: COLONOSCOPY;  Surgeon: Daneil Dolin, MD;  Location: [facility];  Service: Endoscopy;  Laterality: N/A;  130   . PROSTATECTOMY         Family History  Problem Relation Age of Onset  . Colon cancer Neg Hx     Social History   Tobacco Use  . Smoking status: Former Smoker    Packs/day: 0.50    Years: 40.00    Pack years: 20.00    Types: Cigarettes    Quit date: 02/04/2012     Years since quitting: 8.6  . Smokeless tobacco: Current User    Types: Chew  . Tobacco comment: quit about 5-6 years  Substance Use Topics  . Alcohol use: Yes    Alcohol/week: 24.0 standard drinks    Types: 24 Cans of beer per week    Comment: one case in a week  . Drug use: No    Home Medications Prior to Admission medications   Medication Sig Start Date End Date Taking? Authorizing Provider  aspirin EC 81 MG tablet Take 81 mg by mouth daily.    [provider]  azithromycin (ZITHROMAX) 250 MG tablet Take 2 po the first day then once a day for the next 4 days. 08/05/17   Rolland Porter, MD  bacitracin ointment Apply 1 application topically 3 (three) times daily. 03/22/17   Waynetta Pean, PA-C  fish oil-omega-3 fatty acids 1000 MG capsule Take 1 g by mouth daily.     [provider]  losartan (COZAAR) 100 MG tablet Take 100 mg by mouth daily.  12/30/14   [provider]  polyethylene glycol-electrolytes (TRILYTE) 420 G solution Take 4,000 mLs by mouth as directed. 08/18/15   Rourk, Cristopher Estimable, MD    Allergies  Codeine  Review of Systems   Review of Systems  Respiratory: Positive for shortness of breath.   All other systems reviewed and are negative.   Physical Exam Updated Vital Signs BP 108/62   Pulse 79   Temp 98.5 F (36.9 C) (Oral)   Resp (!) 22   Ht 5\' 8"  (1.727 m)   Wt 72.6 kg   SpO2 90%   BMI 24.33 kg/m   Physical Exam Vitals and nursing note reviewed.  Constitutional:      Appearance: He is well-developed.  HENT:     Head: Normocephalic and atraumatic.     Nose: No congestion or rhinorrhea.     Mouth/Throat:     Mouth: Mucous membranes are moist.     Pharynx: Oropharynx is clear.  Eyes:     Pupils: Pupils are equal, round, and reactive to light.  Cardiovascular:     Rate and Rhythm: Normal rate.  Pulmonary:     Effort: Pulmonary effort is normal. Tachypnea present. No respiratory distress.  Abdominal:     General: There is no  distension.  Musculoskeletal:        General: Normal range of motion.     Cervical back: Normal range of motion.  Skin:    General: Skin is warm and dry.  Neurological:     General: No focal deficit present.     Mental Status: He is alert.     ED Results / Procedures / Treatments   Labs (all labs ordered are listed, but only abnormal results are displayed) Labs Reviewed  RESPIRATORY PANEL BY RT PCR (FLU A&B, COVID)  CULTURE, BLOOD (ROUTINE X 2)  CULTURE, BLOOD (ROUTINE X 2)  LACTIC ACID, PLASMA  LACTIC ACID, PLASMA  CBC WITH DIFFERENTIAL/PLATELET  COMPREHENSIVE METABOLIC PANEL  D-DIMER, QUANTITATIVE (NOT AT [facility])  PROCALCITONIN  LACTATE DEHYDROGENASE  FERRITIN  TRIGLYCERIDES  FIBRINOGEN  C-REACTIVE PROTEIN    EKG None  Radiology DG Chest Portable 1 View  Result Date: 09/17/2020 CLINICAL DATA:  COVID pneumonia EXAM: PORTABLE CHEST 1 VIEW COMPARISON:  08/05/2017 FINDINGS: Lung volumes are small and pulmonary insufflation has diminished since prior examination. Pulmonary insufflation is symmetric. Extensive bilateral mid and lower lung zone airspace infiltrates are identified, likely infectious or inflammatory in nature in typical of those seen in COVID-19 pneumonia. No pneumothorax or pleural effusion. Cardiac size within normal limits. Pulmonary vascularity is normal. No acute bone abnormality. IMPRESSION: Extensive bilateral airspace infiltrates, likely infectious or inflammatory in nature. Electronically Signed   By: Fidela Salisbury MD   On: 09/17/2020 00:49    Procedures .Critical Care Performed by: Merrily Pew, MD Authorized by: Merrily Pew, MD   Critical care provider statement:    Critical care time (minutes):  45   Critical care was necessary to treat or prevent imminent or life-threatening deterioration of the following conditions:  Respiratory failure   Critical care was time spent personally by me on the following activities:  Discussions with  consultants, evaluation of patient's response to treatment, examination of patient, ordering and performing treatments and interventions, ordering and review of laboratory studies, ordering and review of radiographic studies, pulse oximetry, re-evaluation of patient's condition, obtaining history from patient or surrogate and review of old charts   I assumed direction of critical care for this patient from another provider in my specialty: no     (including critical care time)  Medications Ordered in ED Medications - No data to display  ED Course  I have reviewed the  triage vital signs and the nursing notes.  Pertinent labs & imaging results that were available during my care of the patient were reviewed by me and considered in my medical decision making (see chart for details).    MDM Rules/Calculators/A&P                         covid pneumonitis with associated hypoxia. Will verify, xr, labs, admit.   Labs/xr as above. Stable on oxygen. remdesivir started. Decadron started. Will admit.   Final Clinical Impression(s) / ED Diagnoses Final diagnoses:  Acute respiratory failure with hypoxia [facility])    Rx / DC Orders ED Discharge Orders    None       Eboni Coval, Corene Cornea, MD 09/17/20 [phone]

## 2020-09-18 DIAGNOSIS — U071 COVID-19: Secondary | ICD-10-CM | POA: Diagnosis not present

## 2020-09-18 DIAGNOSIS — I10 Essential (primary) hypertension: Secondary | ICD-10-CM | POA: Diagnosis present

## 2020-09-18 DIAGNOSIS — J1282 Pneumonia due to coronavirus disease 2019: Secondary | ICD-10-CM | POA: Diagnosis not present

## 2020-09-18 DIAGNOSIS — R7401 Elevation of levels of liver transaminase levels: Secondary | ICD-10-CM | POA: Diagnosis present

## 2020-09-18 DIAGNOSIS — E871 Hypo-osmolality and hyponatremia: Secondary | ICD-10-CM | POA: Diagnosis present

## 2020-09-18 LAB — COMPREHENSIVE METABOLIC PANEL
ALT: 193 U/L — ABNORMAL HIGH (ref 0–44)
AST: 183 U/L — ABNORMAL HIGH (ref 15–41)
Albumin: 2.8 g/dL — ABNORMAL LOW (ref 3.5–5.0)
Alkaline Phosphatase: 70 U/L (ref 38–126)
Anion gap: 10 (ref 5–15)
BUN: 14 mg/dL (ref 8–23)
CO2: 24 mmol/L (ref 22–32)
Calcium: 8.2 mg/dL — ABNORMAL LOW (ref 8.9–10.3)
Chloride: 101 mmol/L (ref 98–111)
Creatinine, Ser: 0.88 mg/dL (ref 0.61–1.24)
GFR, Estimated: >60 mL/min (ref >60)
Glucose, Bld: 155 mg/dL — ABNORMAL HIGH (ref 70–99)
Potassium: 3.9 mmol/L (ref 3.5–5.1)
Sodium: 135 mmol/L (ref 135–145)
Total Bilirubin: 0.6 mg/dL (ref 0.3–1.2)
Total Protein: 5.6 g/dL — ABNORMAL LOW (ref 6.5–8.1)

## 2020-09-18 LAB — CBC WITH DIFFERENTIAL/PLATELET
Abs Immature Granulocytes: 0.03 10*3/uL (ref 0.00–0.07)
Basophils Absolute: 0 10*3/uL (ref 0.0–0.1)
Basophils Relative: 0 %
Eosinophils Absolute: 0 10*3/uL (ref 0.0–0.5)
Eosinophils Relative: 0 %
HCT: 43.4 % (ref 39.0–52.0)
Hemoglobin: 15.1 g/dL (ref 13.0–17.0)
Immature Granulocytes: 1 %
Lymphocytes Relative: 15 %
Lymphs Abs: 0.8 10*3/uL (ref 0.7–4.0)
MCH: 30.9 pg (ref 26.0–34.0)
MCHC: 34.8 g/dL (ref 30.0–36.0)
MCV: 88.9 fL (ref 80.0–100.0)
Monocytes Absolute: 0.6 10*3/uL (ref 0.1–1.0)
Monocytes Relative: 11 %
Neutro Abs: 3.8 10*3/uL (ref 1.7–7.7)
Neutrophils Relative %: 73 %
Platelets: 211 10*3/uL (ref 150–400)
RBC: 4.88 MIL/uL (ref 4.22–5.81)
RDW: 12.9 % (ref 11.5–15.5)
WBC: 5.2 10*3/uL (ref 4.0–10.5)
nRBC: 0 % (ref 0.0–0.2)

## 2020-09-18 LAB — MAGNESIUM: Magnesium: 2.2 mg/dL (ref 1.7–2.4)

## 2020-09-18 LAB — BRAIN NATRIURETIC PEPTIDE: B Natriuretic Peptide: 81.2 pg/mL (ref 0.0–100.0)

## 2020-09-18 LAB — D-DIMER, QUANTITATIVE: D-Dimer, Quant: 0.68 ug/mL-FEU — ABNORMAL HIGH (ref 0.00–0.50)

## 2020-09-18 MED ORDER — ADULT MULTIVITAMIN W/MINERALS CH
1.0000 | ORAL_TABLET | Freq: Every day | ORAL | Status: DC
  Administered 2020-09-19 – 2020-09-28 (×10): 1 via ORAL
  Filled 2020-09-18 (×10): qty 1

## 2020-09-18 MED ORDER — ENSURE ENLIVE PO LIQD
237.0000 mL | Freq: Three times a day (TID) | ORAL | Status: DC
  Administered 2020-09-18 – 2020-09-28 (×24): 237 mL via ORAL
  Filled 2020-09-18 (×2): qty 237

## 2020-09-18 MED ORDER — METHYLPREDNISOLONE SODIUM SUCC 125 MG IJ SOLR
60.0000 mg | Freq: Two times a day (BID) | INTRAMUSCULAR | Status: DC
  Administered 2020-09-18 – 2020-09-22 (×9): 60 mg via INTRAVENOUS
  Filled 2020-09-18 (×9): qty 2

## 2020-09-18 NOTE — Evaluation (Addendum)
Physical Therapy Evaluation Patient Details Name: Howard Sawyer MRN: [id number] DOB: 1941-09-10 Today's Date: 09/18/2020   History of Present Illness  79 yo male admitted to ED on 10/14 from [facility] jail for covid PNA. PMH includes HTN, HLD, prostate cancer.  Clinical Impression   Pt presents with generalized weakness, dyspnea on exertion with decreased knowledge of energy conservation techniques, and decreased activity tolerance vs baseline. Pt to benefit from acute PT to address deficits. Pt ambulated hallway distance with use of RW for steadying and energy conservation, PT and OT cuing pt in breathing technique and rest breaks as needed. Pt currently requiring 4LO2 during mobility, with SpO2 min during session 84% (recovered to 88% and greater with rest). PT to progress mobility as tolerated, and will continue to follow acutely.      Follow Up Recommendations Supervision for mobility/OOB    Equipment Recommendations  Other (comment) (TBD)    Recommendations for Other Services       Precautions / Restrictions Precautions Precautions: Fall Restrictions Weight Bearing Restrictions: No      Mobility  Bed Mobility Overal bed mobility: Needs Assistance Bed Mobility: Supine to Sit     Supine to sit: Supervision     General bed mobility comments: for safety, increased time.  Transfers Overall transfer level: Needs assistance Equipment used: Rolling walker (2 wheeled) Transfers: Sit to/from Stand Sit to Stand: Min guard         General transfer comment: for safety, verbal cuing for hand placement when rising/sitting. STS x3 during session.  Ambulation/Gait Ambulation/Gait assistance: Min guard Gait Distance (Feet): 35 Feet (+35+20) Assistive device: Rolling walker (2 wheeled) Gait Pattern/deviations: Step-through pattern;Decreased stride length;Trunk flexed Gait velocity: decr   General Gait Details: Min gaurd for safety, verbal cuing for upright  posture, pursed lip breathing technique, cues for seated rest break to recover DOE 2/4 and SpO2 at 84% on 4LO2.  Stairs            Wheelchair Mobility    Modified Rankin (Stroke Patients Only)       Balance Overall balance assessment: Needs assistance Sitting-balance support: No upper extremity supported;Feet unsupported Sitting balance-[name] Scale: Good       Standing balance-[name] Scale: Fair Standing balance comment: able to stand and ambulate without AD, but does not accept challenge                             Pertinent Vitals/Pain Pain Assessment: No/denies pain    Home Living Family/patient expects to be discharged to:: [facility]                      Prior Function Level of Independence: Independent         Comments: pt reports independence with mobility, no AD use     Hand Dominance   Dominant Hand: Right    Extremity/Trunk Assessment   Upper Extremity Assessment Upper Extremity Assessment: Defer to OT evaluation    Lower Extremity Assessment Lower Extremity Assessment: Generalized weakness    Cervical / Trunk Assessment Cervical / Trunk Assessment: Kyphotic  Communication   Communication: No difficulties  Cognition Arousal/Alertness: Awake/alert Behavior During Therapy: WFL for tasks assessed/performed Overall Cognitive Status: Within Functional Limits for tasks assessed  General Comments General comments (skin integrity, edema, etc.): 4LO2, SpO2 min 85%    Exercises     Assessment/Plan    PT Assessment Patient needs continued PT services  PT Problem List Decreased strength;Decreased mobility;Decreased activity tolerance;Decreased balance;Decreased knowledge of use of DME;Cardiopulmonary status limiting activity       PT Treatment Interventions DME instruction;Therapeutic activities;Gait training;Therapeutic exercise;Patient/family  education;Balance training;Functional mobility training;Neuromuscular re-education    PT Goals (Current goals can be found in the Care Plan section)  Acute Rehab PT Goals Patient Stated Goal: feel stronger PT Goal Formulation: With patient Time For Goal Achievement: 10/02/20 Potential to Achieve Goals: Good    Frequency Min 3X/week   Barriers to discharge        Co-evaluation PT/OT/SLP Co-Evaluation/Treatment: Yes Reason for Co-Treatment: For patient/therapist safety;To address functional/ADL transfers PT goals addressed during session: Mobility/safety with mobility         AM-PAC PT "6 Clicks" Mobility  Outcome Measure Help needed turning from your back to your side while in a flat bed without using bedrails?: None Help needed moving from lying on your back to sitting on the side of a flat bed without using bedrails?: A Little Help needed moving to and from a bed to a chair (including a wheelchair)?: A Little Help needed standing up from a chair using your arms (e.g., wheelchair or bedside chair)?: A Little Help needed to walk in hospital room?: A Little Help needed climbing 3-5 steps with a railing? : A Little 6 Click Score: 19    End of Session Equipment Utilized During Treatment: Oxygen Activity Tolerance: Patient tolerated treatment well;Patient limited by fatigue Patient left: in chair;with call bell/phone within reach;Other (comment) (with police officer vs guard at bedside) Nurse Communication: Mobility status PT Visit Diagnosis: Other abnormalities of gait and mobility (R26.89);Difficulty in walking, not elsewhere classified (R26.2)    Time: 9407-6808 PT Time Calculation (min) (ACUTE ONLY): 27 min   Charges:   PT Evaluation $PT Eval Low Complexity: 1 Low          Naveen Clardy E, PT Acute Rehabilitation Services Pager [phone]  Office [phone]   Raistlin Gum D Elonda Husky 09/18/2020, 3:56 PM

## 2020-09-18 NOTE — Progress Notes (Signed)
Initial Nutrition Assessment  DOCUMENTATION CODES:   Not applicable  INTERVENTION:    Ensure Enlive po TID, each supplement provides 350 kcal and 20 grams of protein  MVI with minerals daily  NUTRITION DIAGNOSIS:   Increased nutrient needs related to catabolic illness ([id number] PNA) as evidenced by estimated needs.  GOAL:   Patient will meet greater than or equal to 90% of their needs  MONITOR:   PO intake, Supplement acceptance  REASON FOR ASSESSMENT:   Consult Assessment of nutrition requirement/status  ASSESSMENT:   79 yo male admitted from jail with progressive SOB, weakness, poor appetite, and cough r/t COVID PNA. PMH includes HTN, HLD, diverticulosis, prostate cancer.   Patient was eating poorly PTA, likely d/t COVID symptoms (poor appetite, altered taste, SOB). Unable to speak with patient at this time.  No recent weights available for review.   Currently on a regular diet. Meal intakes not recorded.   Labs reviewed.  Medications reviewed and include vitamin C, Solumedrol, zinc, remdesivir.  Patient is at increased nutrition risk, given recent poor appetite, altered taste, poor intake, with catabolic illness. Will add PO supplements to maximize intake of protein and calories.  NUTRITION - FOCUSED PHYSICAL EXAM:  unable to complete  Diet Order:   Diet Order            Diet regular Room service appropriate? Yes; Fluid consistency: Thin  Diet effective now                 EDUCATION NEEDS:   No education needs have been identified at this time  Skin:  Skin Assessment: Reviewed RN Assessment  Last BM:  10/14  Height:   Ht Readings from Last 1 Encounters:  09/17/20 5\' 8"  (1.727 m)    Weight:   Wt Readings from Last 1 Encounters:  09/17/20 72.6 kg    Ideal Body Weight:  70 kg  BMI:  Body mass index is 24.33 kg/m.  Estimated Nutritional Needs:   Kcal:  2000-2200  Protein:  100-120 gm  Fluid:  >/= 2 L    Lucas Mallow, RD, LDN,  CNSC Please refer to Amion for contact information.

## 2020-09-18 NOTE — Evaluation (Signed)
Occupational Therapy Evaluation Patient Details Name: Howard Sawyer MRN: [id number] DOB: [birth year] Today's Date: 09/18/2020    History of Present Illness 79 yo male admitted to ED on 10/14 from [facility] for covid PNA. PMH includes HTN, HLD, prostate cancer.   Clinical Impression   PTA, pt was independent with ADLs. Pt currently requiring Supervision-Min Guard A for safety with RW. Pt presenting with decreased strength and activity tolerance. Pt SpO2 dropping to 85% on 4L during mobility and requiring seated rest breaks for recovery. Pt would benefit from further acute OT to facilitate safe dc. Recommend dc to home as medically stable per physician. Pt in police custody and will return to [facility] jail.    Follow Up Recommendations  No OT follow up    Equipment Recommendations  None recommended by OT    Recommendations for Other Services PT consult     Precautions / Restrictions Precautions Precautions: Fall Restrictions Weight Bearing Restrictions: No      Mobility Bed Mobility Overal bed mobility: Needs Assistance Bed Mobility: Supine to Sit     Supine to sit: Supervision     General bed mobility comments: for safety, increased time.  Transfers Overall transfer level: Needs assistance Equipment used: Rolling walker (2 wheeled) Transfers: Sit to/from Stand Sit to Stand: Min guard         General transfer comment: for safety, verbal cuing for hand placement when rising/sitting. STS x3 during session.    Balance Overall balance assessment: Needs assistance Sitting-balance support: No upper extremity supported;Feet unsupported Sitting balance-[name] Scale: Good       Standing balance-[name] Scale: Fair Standing balance comment: able to stand and ambulate without AD, but does not accept challenge                           ADL either performed or assessed with clinical judgement   ADL Overall ADL's : Needs  assistance/impaired Eating/Feeding: Supervision/ safety;Set up;Sitting   Grooming: Supervision/safety;Set up;Sitting   Upper Body Bathing: Supervision/ safety;Set up;Sitting   Lower Body Bathing: Min guard;Sit to/from stand   Upper Body Dressing : Supervision/safety;Set up;Sitting   Lower Body Dressing: Min guard;Sit to/from stand Lower Body Dressing Details (indicate cue type and reason): Requiring assistance due to shackles.  Toilet Transfer: Min guard;Ambulation;BSC;RW           Functional mobility during ADLs: Min guard;Rolling walker General ADL Comments: Pt currently requiring Supervision-Min Guard A for safety. SpO2 maintaining in 85s on 4L O2.      Vision Baseline Vision/History: Wears glasses Patient Visual Report: No change from baseline       Perception     Praxis      Pertinent Vitals/Pain Pain Assessment: No/denies pain     Hand Dominance Right   Extremity/Trunk Assessment Upper Extremity Assessment Upper Extremity Assessment: Overall WFL for tasks assessed   Lower Extremity Assessment Lower Extremity Assessment: Defer to PT evaluation   Cervical / Trunk Assessment Cervical / Trunk Assessment: Kyphotic   Communication Communication Communication: No difficulties   Cognition Arousal/Alertness: Awake/alert Behavior During Therapy: WFL for tasks assessed/performed Overall Cognitive Status: Within Functional Limits for tasks assessed                                     General Comments  4LO2, SpO2 min 85%    Exercises     Shoulder  Instructions      Home Living Family/patient expects to be discharged to:: [facility]                                        Prior Functioning/Environment Level of Independence: Independent        Comments: pt reports independence with mobility, no AD use        OT Problem List: Decreased strength;Decreased range of motion;Decreased activity tolerance;Impaired  balance (sitting and/or standing);Decreased knowledge of use of DME or AE;Decreased knowledge of precautions;Cardiopulmonary status limiting activity      OT Treatment/Interventions: Self-care/ADL training;Therapeutic exercise;Energy conservation;DME and/or AE instruction;Therapeutic activities;Patient/family education    OT Goals(Current goals can be found in the care plan section) Acute Rehab OT Goals Patient Stated Goal: feel stronger OT Goal Formulation: With patient Time For Goal Achievement: 10/02/20 Potential to Achieve Goals: Good  OT Frequency: Min 2X/week   Barriers to D/C:            Co-evaluation PT/OT/SLP Co-Evaluation/Treatment: Yes Reason for Co-Treatment: For patient/therapist safety;To address functional/ADL transfers PT goals addressed during session: Mobility/safety with mobility OT goals addressed during session: ADL's and self-care      AM-PAC OT "6 Clicks" Daily Activity     Outcome Measure Help from another person eating meals?: A Little Help from another person taking care of personal grooming?: A Little Help from another person toileting, which includes using toliet, bedpan, or urinal?: A Little Help from another person bathing (including washing, rinsing, drying)?: A Little Help from another person to put on and taking off regular upper body clothing?: A Little Help from another person to put on and taking off regular lower body clothing?: A Little 6 Click Score: 18   End of Session Equipment Utilized During Treatment: Rolling walker;Oxygen (4L) Nurse Communication: Mobility status  Activity Tolerance: Patient tolerated treatment well Patient left: in chair;with call bell/phone within reach;Other (comment) (gaurd)  OT Visit Diagnosis: Unsteadiness on feet (R26.81);Other abnormalities of gait and mobility (R26.89);Muscle weakness (generalized) (M62.81)                Time: 9379-0240 OT Time Calculation (min): 27 min Charges:  OT General  Charges $OT Visit: 1 Visit OT Evaluation $OT Eval Moderate Complexity: Brice, OTR/L Acute Rehab Pager: [phone] Office: [place] 09/18/2020, 6:24 PM

## 2020-09-18 NOTE — Progress Notes (Signed)
PROGRESS NOTE    Howard Sawyer  ACZ:[id number] DOB: Dec 20, 1940 DOA: 09/17/2020 PCP: Asencion Noble, MD    Brief Narrative:  Howard Sawyer is a 79 year-old male with past medical history notable for essential hypertension, hyperlipidemia, diverticulosis, prostate cancer who presented to the emergency department with progressive shortness of breath, weakness, poor appetite, cough fever.  Patient was sent in from the [facility] due to positive Covid test and hypoxia.  On EMS arrival, his oxygen saturation was noted to be in the mid 80s.  In the ED, temperature 98.5, HR 56, RR 16, BP 104/86, SPO2 low 80s on room air improved to mid 90s on 4 L nasal cannula.  Sodium 128, glucose 153, AST 228, ALT 24.  CRP 2.9, procalcitonin 0.14, D-dimer 0.3.  Chest x-ray with bilateral airspace infiltrates consistent with multifocal pneumonia.  Remdesivir and Decadron were initiated in the ED.  Hospital service was consulted for further evaluation and treatment of acute hypoxic respiratory failure secondary to Covid-19 viral pneumonia.   Assessment & Plan:   Principal Problem:   Pneumonia due to COVID-19 virus Active Problems:   Essential hypertension   Hyponatremia   Transaminitis   Acute hypoxic respiratory failure secondary to acute Covid-19 viral pneumonia during the ongoing 2020/2021 Covid 19 Pandemic - POA Patient presenting from jail with complaints of progressive weakness, fatigue, shortness of breath with associated cough and diarrhea.  Covid-19 test at jail was positive.  On arrival Patient was noted to be hypoxic with oxygen saturations in the low 90s.  Elevated inflammatory markers and chest x-ray consistent with multifocal pneumonia. --COVID test: + 10/14 --CRP 2.9 --ddimer 0.83>0.68 --Remdesivir, plan 5-day course (Day #2/5) --Solumedrol 60mg  IV q12h --prone for 2-3hrs every 12hrs if able --Continue supplemental oxygen, titrate to maintain SPO2 greater than 92% --Continue  supportive care with albuterol MDI prn, vitamin C, zinc, Tylenol, antitussives (benzonatate/ Mucinex/Tussionex) --Follow CBC, CMP, D-dimer, ferritin, and CRP daily --Continue airborne/contact isolation precautions for 3 weeks from the day of diagnosis  The treatment plan and use of medications and known side effects were discussed with patient/family. Some of the medications used are based on case reports/anecdotal data.  All other medications being used in the management of COVID-19 based on limited study data.  Complete risks and long-term side effects are unknown, however in the best clinical judgment they seem to be of some benefit.  Patient wanted to proceed with treatment options provided.  Hyponatremia Sodium 128 on admission. Etiology likely secondary to poor oral intake as well as diarrhea. --Na 128>135 --Continue monitor BMP daily  Moderate Protein calorie malnutrition Body mass index is 24.33 kg/m. --nutrition consult  Transaminitis Etiology likely secondary to Covid-19 viral infection.  Acute hepatitis panel negative. --AST 228>183 --ALT 204>193 --Avoid hepatotoxins --Trend CMP daily  Essential hypertension --Losartan 100 mg p.o. daily --Aspirin 81 mg p.o. daily  Weakness/debility/deconditioning --PT/OT eval pending  DVT prophylaxis: Lovenox Code Status: Full code Family Communication: Updated patient extensively at bedside  Disposition Plan:  Status is: Inpatient  Remains inpatient appropriate because:Ongoing diagnostic testing needed not appropriate for outpatient work up, Unsafe d/c plan, IV treatments appropriate due to intensity of illness or inability to take PO and Inpatient level of care appropriate due to severity of illness   Dispo: The patient is from: [facility]              Anticipated d/c is to: [facility]  Anticipated d/c date is: 3 days              Patient currently is not medically stable to  d/c.   Consultants:   None  Procedures:   None  Antimicrobials:   None   Subjective: Patient seen and examined at bedside, resting comfortably in bed.  Sheriff present.  Continues on 5 L nasal cannula.  Continues with mild shortness of breath, cough with clear sputum.  No other complaints or concerns at this time.  Denies headache, no fever/chills/night sweats, no nausea/vomiting/diarrhea, no chest pain, no palpitations, no abdominal pain.  No acute events overnight per nursing staff.  Objective: Vitals:   09/17/20 1200 09/17/20 1404 09/17/20 2300 09/18/20 0531  BP:  (!) 103/56 (!) 114/59 (!) 100/58  Pulse:  64 67 64  Resp: (!) 24 20 20 20   Temp:  97.9 F (36.6 C) 98.1 F (36.7 C) 98.1 F (36.7 C)  TempSrc:  Oral Oral Oral  SpO2: 95% 93% 90% 91%  Weight:      Height:        Intake/Output Summary (Last 24 hours) at 09/18/2020 1219 Last data filed at 09/17/2020 2100 Gross per 24 hour  Intake 240 ml  Output --  Net 240 ml   Filed Weights   09/17/20 0015  Weight: 72.6 kg    Examination:  General exam: Appears calm and comfortable, noted bilateral lower extremity shackles  Respiratory system: Clear to auscultation. Respiratory effort normal without accessory muscle use, on 5 L nasal cannula Cardiovascular system: S1 & S2 heard, RRR. No JVD, murmurs, rubs, gallops or clicks. No pedal edema. Gastrointestinal system: Abdomen is nondistended, soft and nontender. No organomegaly or masses felt. Normal bowel sounds heard. Central nervous system: Alert and oriented. No focal neurological deficits. Extremities: Symmetric 5 x 5 power. Skin: No rashes, lesions or ulcers Psychiatry: Judgement and insight appear normal. Mood & affect appropriate.     Data Reviewed: I have personally reviewed following labs and imaging studies  CBC: Recent Labs  Lab 09/17/20 0128 09/18/20 0316  WBC 3.4* 5.2  NEUTROABS 2.4 3.8  HGB 14.9 15.1  HCT 42.6 43.4  MCV 88.9 88.9  PLT 163  540   Basic Metabolic Panel: Recent Labs  Lab 09/17/20 0128 09/18/20 0316  NA 128* 135  K 3.5 3.9  CL 93* 101  CO2 27 24  GLUCOSE 153* 155*  BUN 14 14  CREATININE 1.07 0.88  CALCIUM 7.8* 8.2*  MG  --  2.2   GFR: Estimated Creatinine Clearance: 68 mL/min (by C-G formula based on SCr of 0.88 mg/dL). Liver Function Tests: Recent Labs  Lab 09/17/20 0128 09/18/20 0316  AST 228* 183*  ALT 204* 193*  ALKPHOS 63 70  BILITOT 0.9 0.6  PROT 6.0* 5.6*  ALBUMIN 3.2* 2.8*   No results for input(s): LIPASE, AMYLASE in the last 168 hours. No results for input(s): AMMONIA in the last 168 hours. Coagulation Profile: No results for input(s): INR, PROTIME in the last 168 hours. Cardiac Enzymes: No results for input(s): CKTOTAL, CKMB, CKMBINDEX, TROPONINI in the last 168 hours. BNP (last 3 results) No results for input(s): PROBNP in the last 8760 hours. HbA1C: No results for input(s): HGBA1C in the last 72 hours. CBG: No results for input(s): GLUCAP in the last 168 hours. Lipid Profile: Recent Labs    09/17/20 0118  TRIG 131   Thyroid Function Tests: No results for input(s): TSH, T4TOTAL, FREET4, T3FREE, THYROIDAB in the last 72 hours.  Anemia Panel: Recent Labs    09/17/20 0118  FERRITIN 3,007*   Sepsis Labs: Recent Labs  Lab 09/17/20 0128  PROCALCITON 0.14  LATICACIDVEN 1.8    Recent Results (from the past 240 hour(s))  Respiratory Panel by RT PCR (Flu A&B, Covid) - Nasopharyngeal Swab     Status: Abnormal   Collection Time: 09/17/20 12:19 AM   Specimen: Nasopharyngeal Swab  Result Value Ref Range Status   SARS Coronavirus 2 by RT PCR POSITIVE (A) NEGATIVE Final    Comment: RESULT CALLED TO, READ BACK BY AND VERIFIED WITH: T EASTER,RN@[id number]  09/17/20 MKELLY (NOTE) SARS-CoV-2 target nucleic acids are DETECTED.  SARS-CoV-2 RNA is generally detectable in upper respiratory specimens  during the acute phase of infection. Positive results are indicative of the  presence of the identified virus, but do not rule out bacterial infection or co-infection with other pathogens not detected by the test. Clinical correlation with patient history and other diagnostic information is necessary to determine patient infection status. The expected result is Negative.  Fact Sheet for Patients:  PinkCheek.be  Fact Sheet for Healthcare Providers: GravelBags.it  This test is not yet approved or cleared by the [place] FDA and  has been authorized for detection and/or diagnosis of SARS-CoV-2 by FDA under an Emergency Use Authorization (EUA).  This EUA will remain in effect (meaning this test can be [place] ed) for the duration of  the COVID-19 declaration under Section 564(b)(1) of the Act, 21 U.S.C. section 360bbb-3(b)(1), unless the authorization is terminated or revoked sooner.      Influenza A by PCR NEGATIVE NEGATIVE Final   Influenza B by PCR NEGATIVE NEGATIVE Final    Comment: (NOTE) The Xpert Xpress SARS-CoV-2/FLU/RSV assay is intended as an aid in  the diagnosis of influenza from Nasopharyngeal swab specimens and  should not be used as a sole basis for treatment. Nasal washings and  aspirates are unacceptable for Xpert Xpress SARS-CoV-2/FLU/RSV  testing.  Fact Sheet for Patients: PinkCheek.be  Fact Sheet for Healthcare Providers: GravelBags.it  This test is not yet approved or cleared by the [place] FDA and  has been authorized for detection and/or diagnosis of SARS-CoV-2 by  FDA under an Emergency Use Authorization (EUA). This EUA will remain  in effect (meaning this test can be used) for the duration of the  Covid-19 declaration under Section 564(b)(1) of the Act, 21  U.S.C. section 360bbb-3(b)(1), unless the authorization is  terminated or revoked. Performed at [facility], [address], [place], [place] [address]    Blood Culture (routine x 2)     Status: None (Preliminary result)   Collection Time: 09/17/20  1:18 AM   Specimen: BLOOD  Result Value Ref Range Status   Specimen Description BLOOD  Final   Special Requests NONE  Final   Culture   Final    NO GROWTH 1 DAY Performed at [facility], [address] [place], [place] [address]    Report Status PENDING  Incomplete  Blood Culture (routine x 2)     Status: None (Preliminary result)   Collection Time: 09/17/20  1:21 AM   Specimen: BLOOD  Result Value Ref Range Status   Specimen Description BLOOD RIGHT ANTECUBITAL  Final   Special Requests   Final    BOTTLES DRAWN AEROBIC AND ANAEROBIC Blood Culture adequate volume   Culture   Final    NO GROWTH 1 DAY Performed at [facility], [address], [place], [place] [address]  Report Status PENDING  Incomplete         Radiology Studies: DG Chest Portable 1 View  Result Date: 09/17/2020 CLINICAL DATA:  COVID pneumonia EXAM: PORTABLE CHEST 1 VIEW COMPARISON:  08/05/2017 FINDINGS: Lung volumes are small and pulmonary insufflation has diminished since prior examination. Pulmonary insufflation is symmetric. Extensive bilateral mid and lower lung zone airspace infiltrates are identified, likely infectious or inflammatory in nature in typical of those seen in COVID-19 pneumonia. No pneumothorax or pleural effusion. Cardiac size within normal limits. Pulmonary vascularity is normal. No acute bone abnormality. IMPRESSION: Extensive bilateral airspace infiltrates, likely infectious or inflammatory in nature. Electronically Signed   By: Fidela Salisbury MD   On: 09/17/2020 00:49        Scheduled Meds: . vitamin C  500 mg Oral Daily  . aspirin EC  81 mg Oral Daily  . enoxaparin (LOVENOX) injection  40 mg Subcutaneous Q24H  . losartan  100 mg Oral Daily  . methylPREDNISolone (SOLU-MEDROL) injection  60 mg Intravenous Q12H  . zinc sulfate  220 mg Oral Daily   Continuous Infusions: . remdesivir  100 mg in NS 100 mL 100 mg (09/18/20 0902)     LOS: 1 day    Time spent: 36 minutes spent on chart review, discussion with nursing staff, consultants, updating family and interview/physical exam; more than 50% of that time was spent in counseling and/or coordination of care.    Curtisha Bendix J [place] DO [facility] Available via Epic secure chat 7am-7pm After these hours, please refer to coverage provider listed on [email or web address] 09/18/2020, 12:19 PM

## 2020-09-19 DIAGNOSIS — U071 COVID-19: Secondary | ICD-10-CM | POA: Diagnosis not present

## 2020-09-19 DIAGNOSIS — J1282 Pneumonia due to coronavirus disease 2019: Secondary | ICD-10-CM | POA: Diagnosis not present

## 2020-09-19 LAB — MAGNESIUM: Magnesium: 2.2 mg/dL (ref 1.7–2.4)

## 2020-09-19 LAB — CBC WITH DIFFERENTIAL/PLATELET
Abs Immature Granulocytes: 0.05 10*3/uL (ref 0.00–0.07)
Basophils Absolute: 0 10*3/uL (ref 0.0–0.1)
Basophils Relative: 0 %
Eosinophils Absolute: 0 10*3/uL (ref 0.0–0.5)
Eosinophils Relative: 0 %
HCT: 41.8 % (ref 39.0–52.0)
Hemoglobin: 14.2 g/dL (ref 13.0–17.0)
Immature Granulocytes: 1 %
Lymphocytes Relative: 11 %
Lymphs Abs: 0.8 10*3/uL (ref 0.7–4.0)
MCH: 30.3 pg (ref 26.0–34.0)
MCHC: 34 g/dL (ref 30.0–36.0)
MCV: 89.1 fL (ref 80.0–100.0)
Monocytes Absolute: 0.4 10*3/uL (ref 0.1–1.0)
Monocytes Relative: 6 %
Neutro Abs: 6.3 10*3/uL (ref 1.7–7.7)
Neutrophils Relative %: 82 %
Platelets: 268 10*3/uL (ref 150–400)
RBC: 4.69 MIL/uL (ref 4.22–5.81)
RDW: 13 % (ref 11.5–15.5)
WBC: 7.6 10*3/uL (ref 4.0–10.5)
nRBC: 0 % (ref 0.0–0.2)

## 2020-09-19 LAB — COMPREHENSIVE METABOLIC PANEL
ALT: 145 U/L — ABNORMAL HIGH (ref 0–44)
AST: 101 U/L — ABNORMAL HIGH (ref 15–41)
Albumin: 2.7 g/dL — ABNORMAL LOW (ref 3.5–5.0)
Alkaline Phosphatase: 63 U/L (ref 38–126)
Anion gap: 9 (ref 5–15)
BUN: 17 mg/dL (ref 8–23)
CO2: 26 mmol/L (ref 22–32)
Calcium: 8.2 mg/dL — ABNORMAL LOW (ref 8.9–10.3)
Chloride: 100 mmol/L (ref 98–111)
Creatinine, Ser: 0.97 mg/dL (ref 0.61–1.24)
GFR, Estimated: >60 mL/min (ref >60)
Glucose, Bld: 181 mg/dL — ABNORMAL HIGH (ref 70–99)
Potassium: 4.1 mmol/L (ref 3.5–5.1)
Sodium: 135 mmol/L (ref 135–145)
Total Bilirubin: 0.7 mg/dL (ref 0.3–1.2)
Total Protein: 5.3 g/dL — ABNORMAL LOW (ref 6.5–8.1)

## 2020-09-19 LAB — BRAIN NATRIURETIC PEPTIDE: B Natriuretic Peptide: 88.8 pg/mL (ref 0.0–100.0)

## 2020-09-19 LAB — D-DIMER, QUANTITATIVE: D-Dimer, Quant: 0.39 ug/mL-FEU (ref 0.00–0.50)

## 2020-09-19 NOTE — Progress Notes (Signed)
PROGRESS NOTE    WILMONT OLUND  YQI:[id number] DOB: [birth year] DOA: 09/17/2020 PCP: Asencion Noble, MD    Brief Narrative:  ABANOUB HANKEN is a 79 year-old male with past medical history notable for essential hypertension, hyperlipidemia, diverticulosis, prostate cancer who presented to the emergency department with progressive shortness of breath, weakness, poor appetite, cough fever.  Patient was sent in from the [facility] due to positive Covid test and hypoxia.  On EMS arrival, his oxygen saturation was noted to be in the mid 80s.  In the ED, temperature 98.5, HR 56, RR 16, BP 104/86, SPO2 low 80s on room air improved to mid 90s on 4 L nasal cannula.  Sodium 128, glucose 153, AST 228, ALT 24.  CRP 2.9, procalcitonin 0.14, D-dimer 0.3.  Chest x-ray with bilateral airspace infiltrates consistent with multifocal pneumonia.  Remdesivir and Decadron were initiated in the ED.  Hospital service was consulted for further evaluation and treatment of acute hypoxic respiratory failure secondary to Covid-19 viral pneumonia.   Assessment & Plan:   Principal Problem:   Pneumonia due to COVID-19 virus Active Problems:   Essential hypertension   Hyponatremia   Transaminitis   Acute hypoxic respiratory failure secondary to acute Covid-19 viral pneumonia during the ongoing 2020/2021 Covid 19 Pandemic - POA [facility] with complaints of progressive weakness, fatigue, shortness of breath with associated cough and diarrhea.  Covid-19 test at jail was positive.  On arrival Patient was noted to be hypoxic with oxygen saturations in the low 90s.  Elevated inflammatory markers and chest x-ray consistent with multifocal pneumonia. --COVID test: + 10/14 --CRP 2.9 --ddimer 0.83>0.68>0.9 --Remdesivir, plan 5-day course (Day #3/5) --Solumedrol 60mg  IV q12h --prone for 2-3hrs every 12hrs if able --Continue supplemental oxygen, titrate to maintain SPO2 greater than 92%, on 4L [place]  with SPO2 94%. --Continue supportive care with albuterol MDI prn, vitamin C, zinc, Tylenol, antitussives (benzonatate/ Mucinex/Tussionex) --Follow CBC, CMP, D-dimer, ferritin, and CRP daily --Continue airborne/contact isolation precautions for 3 weeks from the day of diagnosis  The treatment plan and use of medications and known side effects were discussed with patient/family. Some of the medications used are based on case reports/anecdotal data.  All other medications being used in the management of COVID-19 based on limited study data.  Complete risks and long-term side effects are unknown, however in the best clinical judgment they seem to be of some benefit.  Patient wanted to proceed with treatment options provided.  Hyponatremia Sodium 128 on admission. Etiology likely secondary to poor oral intake as well as diarrhea. --Na [phone] --Continue monitor BMP daily  Moderate Protein calorie malnutrition Body mass index is 24.33 kg/m. --nutrition following --Continue supplementation with Ensure 3 times daily, multivitamin  Transaminitis Etiology likely secondary to Covid-19 viral infection.  Acute hepatitis panel negative. --AST 228>183>101 --ALT [phone] --Avoid hepatotoxins --Trend CMP daily  Essential hypertension --Losartan 100 mg p.o. daily --Aspirin 81 mg p.o. daily  Weakness/debility/deconditioning --PT/OT following  DVT prophylaxis: Lovenox Code Status: Full code Family Communication: Updated patient extensively at bedside  Disposition Plan:  Status is: Inpatient  Remains inpatient appropriate because:Ongoing diagnostic testing needed not appropriate for outpatient work up, Unsafe d/c plan, IV treatments appropriate due to intensity of illness or inability to take PO and Inpatient level of care appropriate due to severity of illness   Dispo: The patient is from: [facility]              Anticipated d/c is to:  [facility]               Anticipated d/c date is: 3 days              Patient currently is not medically stable to d/c. Per Sheriff at bedside, jail unable to accommodate patient on oxygen, this will likely be a barrier to discharge.   Consultants:   None  Procedures:   None  Antimicrobials:   None   Subjective: Patient seen and examined at bedside, resting comfortably in bed.  Sheriff present.  Continues on 4 L nasal cannula.  Continues with mild shortness of breath, cough with clear sputum.  No other complaints or concerns at this time.  Denies headache, no fever/chills/night sweats, no nausea/vomiting/diarrhea, no chest pain, no palpitations, no abdominal pain.  No acute events overnight per nursing staff.  Objective: Vitals:   09/18/20 1948 09/19/20 0000 09/19/20 0351 09/19/20 0746  BP: 112/60   (!) 108/57  Pulse: (!) 59 66 (!) 51 64  Resp: 18   18  Temp: 98.2 F (36.8 C)   98.1 F (36.7 C)  TempSrc: Oral     SpO2: 95% 98% 94% 93%  Weight:      Height:        Intake/Output Summary (Last 24 hours) at 09/19/2020 1132 Last data filed at 09/18/2020 1500 Gross per 24 hour  Intake 100 ml  Output --  Net 100 ml   Filed Weights   09/17/20 0015  Weight: 72.6 kg    Examination:  General exam: Appears calm and comfortable, noted bilateral lower extremity shackles  Respiratory system: Clear to auscultation. Respiratory effort normal without accessory muscle use, on 5 L nasal cannula Cardiovascular system: S1 & S2 heard, RRR. No JVD, murmurs, rubs, gallops or clicks. No pedal edema. Gastrointestinal system: Abdomen is nondistended, soft and nontender. No organomegaly or masses felt. Normal bowel sounds heard. Central nervous system: Alert and oriented. No focal neurological deficits. Extremities: Symmetric 5 x 5 power. Skin: No rashes, lesions or ulcers Psychiatry: Judgement and insight appear normal. Mood & affect appropriate.     Data Reviewed: I have personally reviewed following labs  and imaging studies  CBC: Recent Labs  Lab 09/17/20 0128 09/18/20 0316 09/19/20 0324  WBC 3.4* 5.2 7.6  NEUTROABS 2.4 3.8 6.3  HGB 14.9 15.1 14.2  HCT 42.6 43.4 41.8  MCV 88.9 88.9 89.1  PLT 163 211 696   Basic Metabolic Panel: Recent Labs  Lab 09/17/20 0128 09/18/20 0316 09/19/20 0324  NA 128* 135 135  K 3.5 3.9 4.1  CL 93* 101 100  CO2 27 24 26   GLUCOSE 153* 155* 181*  BUN 14 14 17   CREATININE 1.07 0.88 0.97  CALCIUM 7.8* 8.2* 8.2*  MG  --  2.2 2.2   GFR: Estimated Creatinine Clearance: 61.7 mL/min (by C-G formula based on SCr of 0.97 mg/dL). Liver Function Tests: Recent Labs  Lab 09/17/20 0128 09/18/20 0316 09/19/20 0324  AST 228* 183* 101*  ALT 204* 193* 145*  ALKPHOS 63 70 63  BILITOT 0.9 0.6 0.7  PROT 6.0* 5.6* 5.3*  ALBUMIN 3.2* 2.8* 2.7*   No results for input(s): LIPASE, AMYLASE in the last 168 hours. No results for input(s): AMMONIA in the last 168 hours. Coagulation Profile: No results for input(s): INR, PROTIME in the last 168 hours. Cardiac Enzymes: No results for input(s): CKTOTAL, CKMB, CKMBINDEX, TROPONINI in the last 168 hours. BNP (last 3 results) No results for input(s): PROBNP  in the last 8760 hours. HbA1C: No results for input(s): HGBA1C in the last 72 hours. CBG: No results for input(s): GLUCAP in the last 168 hours. Lipid Profile: Recent Labs    09/17/20 0118  TRIG 131   Thyroid Function Tests: No results for input(s): TSH, T4TOTAL, FREET4, T3FREE, THYROIDAB in the last 72 hours. Anemia Panel: Recent Labs    09/17/20 0118  FERRITIN 3,007*   Sepsis Labs: Recent Labs  Lab 09/17/20 0128  PROCALCITON 0.14  LATICACIDVEN 1.8    Recent Results (from the past 240 hour(s))  Respiratory Panel by RT PCR (Flu A&B, Covid) - Nasopharyngeal Swab     Status: Abnormal   Collection Time: 09/17/20 12:19 AM   Specimen: Nasopharyngeal Swab  Result Value Ref Range Status   SARS Coronavirus 2 by RT PCR POSITIVE (A) NEGATIVE Final     Comment: RESULT CALLED TO, READ BACK BY AND VERIFIED WITH: T EASTER,RN@0253  09/17/20 MKELLY (NOTE) SARS-CoV-2 target nucleic acids are DETECTED.  SARS-CoV-2 RNA is generally detectable in upper respiratory specimens  during the acute phase of infection. Positive results are indicative of the presence of the identified virus, but do not rule out bacterial infection or co-infection with other pathogens not detected by the test. Clinical correlation with patient history and other diagnostic information is necessary to determine patient infection status. The expected result is Negative.  Fact Sheet for Patients:  PinkCheek.be  Fact Sheet for Healthcare Providers: GravelBags.it  This test is not yet approved or cleared by the [place] FDA and  has been authorized for detection and/or diagnosis of SARS-CoV-2 by FDA under an Emergency Use Authorization (EUA).  This EUA will remain in effect (meaning this test can be [place] ed) for the duration of  the COVID-19 declaration under Section 564(b)(1) of the Act, 21 U.S.C. section 360bbb-3(b)(1), unless the authorization is terminated or revoked sooner.      Influenza A by PCR NEGATIVE NEGATIVE Final   Influenza B by PCR NEGATIVE NEGATIVE Final    Comment: (NOTE) The Xpert Xpress SARS-CoV-2/FLU/RSV assay is intended as an aid in  the diagnosis of influenza from Nasopharyngeal swab specimens and  should not be used as a sole basis for treatment. Nasal washings and  aspirates are unacceptable for Xpert Xpress SARS-CoV-2/FLU/RSV  testing.  Fact Sheet for Patients: PinkCheek.be  Fact Sheet for Healthcare Providers: GravelBags.it  This test is not yet approved or cleared by the [place] FDA and  has been authorized for detection and/or diagnosis of SARS-CoV-2 by  FDA under an Emergency Use Authorization (EUA). This EUA  will remain  in effect (meaning this test can be used) for the duration of the  Covid-19 declaration under Section 564(b)(1) of the Act, 21  U.S.C. section 360bbb-3(b)(1), unless the authorization is  terminated or revoked. Performed at [facility], [address] [place], [place] [address]   Blood Culture (routine x 2)     Status: None (Preliminary result)   Collection Time: 09/17/20  1:18 AM   Specimen: BLOOD  Result Value Ref Range Status   Specimen Description BLOOD  Final   Special Requests NONE  Final   Culture   Final    NO GROWTH 2 DAYS Performed at [facility], [address] [place], [place] [address]    Report Status PENDING  Incomplete  Blood Culture (routine x 2)     Status: None (Preliminary result)   Collection Time: 09/17/20  1:21 AM   Specimen: BLOOD  Result Value  Ref Range Status   Specimen Description BLOOD RIGHT ANTECUBITAL  Final   Special Requests   Final    BOTTLES DRAWN AEROBIC AND ANAEROBIC Blood Culture adequate volume   Culture   Final    NO GROWTH 2 DAYS Performed at [facility], [address] [place], [place] [address]    Report Status PENDING  Incomplete         Radiology Studies: No results found.      Scheduled Meds: . vitamin C  500 mg Oral Daily  . aspirin EC  81 mg Oral Daily  . enoxaparin (LOVENOX) injection  40 mg Subcutaneous Q24H  . feeding supplement  237 mL Oral TID BM  . losartan  100 mg Oral Daily  . methylPREDNISolone (SOLU-MEDROL) injection  60 mg Intravenous Q12H  . multivitamin with minerals  1 tablet Oral Daily  . zinc sulfate  220 mg Oral Daily   Continuous Infusions: . remdesivir 100 mg in NS 100 mL 100 mg (09/19/20 1002)     LOS: 2 days    Time spent: 36 minutes spent on chart review, discussion with nursing staff, consultants, updating family and interview/physical exam; more than 50% of that time was spent in counseling and/or coordination of care.    Jachin Coury J [place] DO [facility] Available via Epic secure chat 7am-7pm After these hours, please refer to coverage provider listed on [email or web address] 09/19/2020, 11:32 AM

## 2020-09-20 DIAGNOSIS — U071 COVID-19: Secondary | ICD-10-CM | POA: Diagnosis not present

## 2020-09-20 DIAGNOSIS — J1282 Pneumonia due to coronavirus disease 2019: Secondary | ICD-10-CM | POA: Diagnosis not present

## 2020-09-20 LAB — CBC WITH DIFFERENTIAL/PLATELET
Abs Immature Granulocytes: 0.07 10*3/uL (ref 0.00–0.07)
Basophils Absolute: 0 10*3/uL (ref 0.0–0.1)
Basophils Relative: 0 %
Eosinophils Absolute: 0 10*3/uL (ref 0.0–0.5)
Eosinophils Relative: 0 %
HCT: 42 % (ref 39.0–52.0)
Hemoglobin: 14 g/dL (ref 13.0–17.0)
Immature Granulocytes: 1 %
Lymphocytes Relative: 9 %
Lymphs Abs: 0.8 10*3/uL (ref 0.7–4.0)
MCH: 30 pg (ref 26.0–34.0)
MCHC: 33.3 g/dL (ref 30.0–36.0)
MCV: 89.9 fL (ref 80.0–100.0)
Monocytes Absolute: 0.7 10*3/uL (ref 0.1–1.0)
Monocytes Relative: 8 %
Neutro Abs: 7.3 10*3/uL (ref 1.7–7.7)
Neutrophils Relative %: 82 %
Platelets: 299 10*3/uL (ref 150–400)
RBC: 4.67 MIL/uL (ref 4.22–5.81)
RDW: 12.8 % (ref 11.5–15.5)
WBC: 8.9 10*3/uL (ref 4.0–10.5)
nRBC: 0 % (ref 0.0–0.2)

## 2020-09-20 LAB — COMPREHENSIVE METABOLIC PANEL
ALT: 136 U/L — ABNORMAL HIGH (ref 0–44)
AST: 82 U/L — ABNORMAL HIGH (ref 15–41)
Albumin: 2.7 g/dL — ABNORMAL LOW (ref 3.5–5.0)
Alkaline Phosphatase: 63 U/L (ref 38–126)
Anion gap: 8 (ref 5–15)
BUN: 24 mg/dL — ABNORMAL HIGH (ref 8–23)
CO2: 29 mmol/L (ref 22–32)
Calcium: 8.4 mg/dL — ABNORMAL LOW (ref 8.9–10.3)
Chloride: 100 mmol/L (ref 98–111)
Creatinine, Ser: 1.01 mg/dL (ref 0.61–1.24)
GFR, Estimated: >60 mL/min (ref >60)
Glucose, Bld: 194 mg/dL — ABNORMAL HIGH (ref 70–99)
Potassium: 4.4 mmol/L (ref 3.5–5.1)
Sodium: 137 mmol/L (ref 135–145)
Total Bilirubin: 0.6 mg/dL (ref 0.3–1.2)
Total Protein: 5.2 g/dL — ABNORMAL LOW (ref 6.5–8.1)

## 2020-09-20 LAB — D-DIMER, QUANTITATIVE: D-Dimer, Quant: 0.44 ug/mL-FEU (ref 0.00–0.50)

## 2020-09-20 LAB — C-REACTIVE PROTEIN: CRP: 1 mg/dL — ABNORMAL HIGH (ref <1.0)

## 2020-09-20 LAB — MAGNESIUM: Magnesium: 2.3 mg/dL (ref 1.7–2.4)

## 2020-09-20 NOTE — Progress Notes (Signed)
PROGRESS NOTE    Howard Sawyer  QTM:[id number] DOB: 06/13/1941 DOA: 09/17/2020 PCP: Asencion Noble, MD    Brief Narrative:  Howard Sawyer is a 79 year-old male with past medical history notable for essential hypertension, hyperlipidemia, diverticulosis, prostate cancer who presented to the emergency department with progressive shortness of breath, weakness, poor appetite, cough fever.  Patient was sent in from the [facility] due to positive Covid test and hypoxia.  On EMS arrival, his oxygen saturation was noted to be in the mid 80s.  In the ED, temperature 98.5, HR 56, RR 16, BP 104/86, SPO2 low 80s on room air improved to mid 90s on 4 L nasal cannula.  Sodium 128, glucose 153, AST 228, ALT 24.  CRP 2.9, procalcitonin 0.14, D-dimer 0.3.  Chest x-ray with bilateral airspace infiltrates consistent with multifocal pneumonia.  Remdesivir and Decadron were initiated in the ED.  Hospital service was consulted for further evaluation and treatment of acute hypoxic respiratory failure secondary to Covid-19 viral pneumonia.   Assessment & Plan:   Principal Problem:   Pneumonia due to COVID-19 virus Active Problems:   Essential hypertension   Hyponatremia   Transaminitis   Acute hypoxic respiratory failure secondary to acute Covid-19 viral pneumonia during the ongoing 2020/2021 Covid 19 Pandemic - POA Patient presenting from jail with complaints of progressive weakness, fatigue, shortness of breath with associated cough and diarrhea.  Covid-19 test at jail was positive.  On arrival Patient was noted to be hypoxic with oxygen saturations in the low 90s.  Elevated inflammatory markers and chest x-ray consistent with multifocal pneumonia. --COVID test: + 10/14 --CRP 2.9>1.0 --ddimer 0.83>0.68>0.39>0.44 --Remdesivir, plan 5-day course (Day #4/5) --Solumedrol 60mg  IV q12h --prone for 2-3hrs every 12hrs if able --Continue supplemental oxygen, titrate to maintain SPO2 greater than 92%,  on 2L [place] with SPO2 94%. --Continue supportive care with albuterol MDI prn, vitamin C, zinc, Tylenol, antitussives (benzonatate/ Mucinex/Tussionex) --Follow CBC, CMP, D-dimer, ferritin, and CRP daily --Continue airborne/contact isolation precautions for 3 weeks from the day of diagnosis  The treatment plan and use of medications and known side effects were discussed with patient/family. Some of the medications used are based on case reports/anecdotal data.  All other medications being used in the management of COVID-19 based on limited study data.  Complete risks and long-term side effects are unknown, however in the best clinical judgment they seem to be of some benefit.  Patient wanted to proceed with treatment options provided.  Hyponatremia Sodium 128 on admission. Etiology likely secondary to poor oral intake as well as diarrhea. --Na [phone] --Continue monitor BMP daily  Moderate Protein calorie malnutrition Body mass index is 24.33 kg/m. --nutrition following --Continue supplementation with Ensure 3 times daily, multivitamin  Transaminitis Etiology likely secondary to Covid-19 viral infection.  Acute hepatitis panel negative. --AST 228>183>101>82 --ALT [phone] --Avoid hepatotoxins --Trend CMP daily  Essential hypertension --Losartan 100 mg p.o. daily --Aspirin 81 mg p.o. daily  Weakness/debility/deconditioning --PT/OT following  DVT prophylaxis: Lovenox Code Status: Full code Family Communication: Updated patient extensively at bedside  Disposition Plan:  Status is: Inpatient  Remains inpatient appropriate because:Ongoing diagnostic testing needed not appropriate for outpatient work up, Unsafe d/c plan, IV treatments appropriate due to intensity of illness or inability to take PO and Inpatient level of care appropriate due to severity of illness   Dispo: The patient is from: [facility]              Anticipated d/c is to:  [facility]              Anticipated d/c date is: 3 days              Patient currently is not medically stable to d/c. Per Sheriff at bedside, jail unable to accommodate patient on oxygen, this will likely be a barrier to discharge.   Consultants:   None  Procedures:   None  Antimicrobials:   None   Subjective: Patient seen and examined at bedside, resting comfortably in bed.  Sheriff present.  Continues on 2 L nasal cannula.  Continues with mild shortness of breath, cough with clear sputum.  No other complaints or concerns at this time.  Denies headache, no fever/chills/night sweats, no nausea/vomiting/diarrhea, no chest pain, no palpitations, no abdominal pain.  No acute events overnight per nursing staff.  Objective: Vitals:   09/20/20 0011 09/20/20 0415 09/20/20 0617 09/20/20 0741  BP:   136/64 (!) 106/56  Pulse: (!) 59 (!) 51 67 67  Resp:   18 18  Temp:   97.9 F (36.6 C) 98.8 F (37.1 C)  TempSrc:      SpO2: 91% 91% (!) 81% (!) 89%  Weight:      Height:       No intake or output data in the 24 hours ending 09/20/20 1115 Filed Weights   09/17/20 0015  Weight: 72.6 kg    Examination:  General exam: Appears calm and comfortable, noted bilateral lower extremity shackles  Respiratory system: Clear to auscultation. Respiratory effort normal without accessory muscle use, on 2 L nasal cannula Cardiovascular system: S1 & S2 heard, RRR. No JVD, murmurs, rubs, gallops or clicks. No pedal edema. Gastrointestinal system: Abdomen is nondistended, soft and nontender. No organomegaly or masses felt. Normal bowel sounds heard. Central nervous system: Alert and oriented. No focal neurological deficits. Extremities: Symmetric 5 x 5 power. Skin: No rashes, lesions or ulcers Psychiatry: Judgement and insight appear normal. Mood & affect appropriate.     Data Reviewed: I have personally reviewed following labs and imaging studies  CBC: Recent Labs  Lab 09/17/20 0128  09/18/20 0316 09/19/20 0324 09/20/20 0250  WBC 3.4* 5.2 7.6 8.9  NEUTROABS 2.4 3.8 6.3 7.3  HGB 14.9 15.1 14.2 14.0  HCT 42.6 43.4 41.8 42.0  MCV 88.9 88.9 89.1 89.9  PLT 163 211 268 700   Basic Metabolic Panel: Recent Labs  Lab 09/17/20 0128 09/18/20 0316 09/19/20 0324 09/20/20 0250  NA 128* 135 135 137  K 3.5 3.9 4.1 4.4  CL 93* 101 100 100  CO2 27 24 26 29   GLUCOSE 153* 155* 181* 194*  BUN 14 14 17  24*  CREATININE 1.07 0.88 0.97 1.01  CALCIUM 7.8* 8.2* 8.2* 8.4*  MG  --  2.2 2.2 2.3   GFR: Estimated Creatinine Clearance: 59.3 mL/min (by C-G formula based on SCr of 1.01 mg/dL). Liver Function Tests: Recent Labs  Lab 09/17/20 0128 09/18/20 0316 09/19/20 0324 09/20/20 0250  AST 228* 183* 101* 82*  ALT 204* 193* 145* 136*  ALKPHOS 63 70 63 63  BILITOT 0.9 0.6 0.7 0.6  PROT 6.0* 5.6* 5.3* 5.2*  ALBUMIN 3.2* 2.8* 2.7* 2.7*   No results for input(s): LIPASE, AMYLASE in the last 168 hours. No results for input(s): AMMONIA in the last 168 hours. Coagulation Profile: No results for input(s): INR, PROTIME in the last 168 hours. Cardiac Enzymes: No results for input(s): CKTOTAL, CKMB, CKMBINDEX, TROPONINI in the last 168 hours.  BNP (last 3 results) No results for input(s): PROBNP in the last 8760 hours. HbA1C: No results for input(s): HGBA1C in the last 72 hours. CBG: No results for input(s): GLUCAP in the last 168 hours. Lipid Profile: No results for input(s): CHOL, HDL, LDLCALC, TRIG, CHOLHDL, LDLDIRECT in the last 72 hours. Thyroid Function Tests: No results for input(s): TSH, T4TOTAL, FREET4, T3FREE, THYROIDAB in the last 72 hours. Anemia Panel: No results for input(s): VITAMINB12, FOLATE, FERRITIN, TIBC, IRON, RETICCTPCT in the last 72 hours. Sepsis Labs: Recent Labs  Lab 09/17/20 0128  PROCALCITON 0.14  LATICACIDVEN 1.8    Recent Results (from the past 240 hour(s))  Respiratory Panel by RT PCR (Flu A&B, Covid) - Nasopharyngeal Swab     Status:  Abnormal   Collection Time: 09/17/20 12:19 AM   Specimen: Nasopharyngeal Swab  Result Value Ref Range Status   SARS Coronavirus 2 by RT PCR POSITIVE (A) NEGATIVE Final    Comment: RESULT CALLED TO, READ BACK BY AND VERIFIED WITH: T EASTER,RN@0253  09/17/20 MKELLY (NOTE) SARS-CoV-2 target nucleic acids are DETECTED.  SARS-CoV-2 RNA is generally detectable in upper respiratory specimens  during the acute phase of infection. Positive results are indicative of the presence of the identified virus, but do not rule out bacterial infection or co-infection with other pathogens not detected by the test. Clinical correlation with patient history and other diagnostic information is necessary to determine patient infection status. The expected result is Negative.  Fact Sheet for Patients:  PinkCheek.be  Fact Sheet for Healthcare Providers: GravelBags.it  This test is not yet approved or cleared by the [place] FDA and  has been authorized for detection and/or diagnosis of SARS-CoV-2 by FDA under an Emergency Use Authorization (EUA).  This EUA will remain in effect (meaning this test can be [place] ed) for the duration of  the COVID-19 declaration under Section 564(b)(1) of the Act, 21 U.S.C. section 360bbb-3(b)(1), unless the authorization is terminated or revoked sooner.      Influenza A by PCR NEGATIVE NEGATIVE Final   Influenza B by PCR NEGATIVE NEGATIVE Final    Comment: (NOTE) The Xpert Xpress SARS-CoV-2/FLU/RSV assay is intended as an aid in  the diagnosis of influenza from Nasopharyngeal swab specimens and  should not be used as a sole basis for treatment. Nasal washings and  aspirates are unacceptable for Xpert Xpress SARS-CoV-2/FLU/RSV  testing.  Fact Sheet for Patients: PinkCheek.be  Fact Sheet for Healthcare Providers: GravelBags.it  This test is not yet  approved or cleared by the [place] FDA and  has been authorized for detection and/or diagnosis of SARS-CoV-2 by  FDA under an Emergency Use Authorization (EUA). This EUA will remain  in effect (meaning this test can be used) for the duration of the  Covid-19 declaration under Section 564(b)(1) of the Act, 21  U.S.C. section 360bbb-3(b)(1), unless the authorization is  terminated or revoked. Performed at [facility], [address], [place], [place] [address]   Blood Culture (routine x 2)     Status: None (Preliminary result)   Collection Time: 09/17/20  1:18 AM   Specimen: BLOOD  Result Value Ref Range Status   Specimen Description BLOOD  Final   Special Requests NONE  Final   Culture   Final    NO GROWTH 2 DAYS Performed at [facility], [address], [place], [place] [address]    Report Status PENDING  Incomplete  Blood Culture (routine x 2)     Status: None (Preliminary result)  Collection Time: 09/17/20  1:21 AM   Specimen: BLOOD  Result Value Ref Range Status   Specimen Description BLOOD RIGHT ANTECUBITAL  Final   Special Requests   Final    BOTTLES DRAWN AEROBIC AND ANAEROBIC Blood Culture adequate volume   Culture   Final    NO GROWTH 2 DAYS Performed at [facility], [address] [place],  [address]    Report Status PENDING  Incomplete         Radiology Studies: No results found.      Scheduled Meds: . vitamin C  500 mg Oral Daily  . aspirin EC  81 mg Oral Daily  . enoxaparin (LOVENOX) injection  40 mg Subcutaneous Q24H  . feeding supplement  237 mL Oral TID BM  . losartan  100 mg Oral Daily  . methylPREDNISolone (SOLU-MEDROL) injection  60 mg Intravenous Q12H  . multivitamin with minerals  1 tablet Oral Daily  . zinc sulfate  220 mg Oral Daily   Continuous Infusions: . remdesivir 100 mg in NS 100 mL 100 mg (09/20/20 0834)     LOS: 3 days    Time spent: 36 minutes spent on chart review, discussion with nursing staff, consultants,  updating family and interview/physical exam; more than 50% of that time was spent in counseling and/or coordination of care.    Carlie Solorzano J [place] DO [facility] Available via Epic secure chat 7am-7pm After these hours, please refer to coverage provider listed on [email or web address] 09/20/2020, 11:15 AM

## 2020-09-21 DIAGNOSIS — J1282 Pneumonia due to coronavirus disease 2019: Secondary | ICD-10-CM | POA: Diagnosis not present

## 2020-09-21 DIAGNOSIS — U071 COVID-19: Secondary | ICD-10-CM | POA: Diagnosis not present

## 2020-09-21 LAB — CBC WITH DIFFERENTIAL/PLATELET
Abs Immature Granulocytes: 0.08 10*3/uL — ABNORMAL HIGH (ref 0.00–0.07)
Basophils Absolute: 0 10*3/uL (ref 0.0–0.1)
Basophils Relative: 0 %
Eosinophils Absolute: 0 10*3/uL (ref 0.0–0.5)
Eosinophils Relative: 0 %
HCT: 39.4 % (ref 39.0–52.0)
Hemoglobin: 13.3 g/dL (ref 13.0–17.0)
Immature Granulocytes: 1 %
Lymphocytes Relative: 7 %
Lymphs Abs: 0.7 10*3/uL (ref 0.7–4.0)
MCH: 30.3 pg (ref 26.0–34.0)
MCHC: 33.8 g/dL (ref 30.0–36.0)
MCV: 89.7 fL (ref 80.0–100.0)
Monocytes Absolute: 0.6 10*3/uL (ref 0.1–1.0)
Monocytes Relative: 6 %
Neutro Abs: 8.6 10*3/uL — ABNORMAL HIGH (ref 1.7–7.7)
Neutrophils Relative %: 86 %
Platelets: 310 10*3/uL (ref 150–400)
RBC: 4.39 MIL/uL (ref 4.22–5.81)
RDW: 12.7 % (ref 11.5–15.5)
WBC: 9.7 10*3/uL (ref 4.0–10.5)
nRBC: 0 % (ref 0.0–0.2)

## 2020-09-21 LAB — COMPREHENSIVE METABOLIC PANEL
ALT: 107 U/L — ABNORMAL HIGH (ref 0–44)
AST: 53 U/L — ABNORMAL HIGH (ref 15–41)
Albumin: 2.4 g/dL — ABNORMAL LOW (ref 3.5–5.0)
Alkaline Phosphatase: 66 U/L (ref 38–126)
Anion gap: 8 (ref 5–15)
BUN: 27 mg/dL — ABNORMAL HIGH (ref 8–23)
CO2: 26 mmol/L (ref 22–32)
Calcium: 8.2 mg/dL — ABNORMAL LOW (ref 8.9–10.3)
Chloride: 103 mmol/L (ref 98–111)
Creatinine, Ser: 0.87 mg/dL (ref 0.61–1.24)
GFR, Estimated: >60 mL/min (ref >60)
Glucose, Bld: 242 mg/dL — ABNORMAL HIGH (ref 70–99)
Potassium: 4.7 mmol/L (ref 3.5–5.1)
Sodium: 137 mmol/L (ref 135–145)
Total Bilirubin: 0.5 mg/dL (ref 0.3–1.2)
Total Protein: 4.8 g/dL — ABNORMAL LOW (ref 6.5–8.1)

## 2020-09-21 LAB — HEMOGLOBIN A1C
Hgb A1c MFr Bld: 5.9 % — ABNORMAL HIGH (ref 4.8–5.6)
Mean Plasma Glucose: 123 mg/dL

## 2020-09-21 LAB — GLUCOSE, CAPILLARY
Glucose-Capillary: 233 mg/dL — ABNORMAL HIGH (ref 70–99)
Glucose-Capillary: 245 mg/dL — ABNORMAL HIGH (ref 70–99)
Glucose-Capillary: 225 mg/dL — ABNORMAL HIGH (ref 70–99)
Glucose-Capillary: 218 mg/dL — ABNORMAL HIGH (ref 70–99)

## 2020-09-21 LAB — MAGNESIUM: Magnesium: 2.2 mg/dL (ref 1.7–2.4)

## 2020-09-21 LAB — D-DIMER, QUANTITATIVE: D-Dimer, Quant: 0.37 ug/mL-FEU (ref 0.00–0.50)

## 2020-09-21 LAB — C-REACTIVE PROTEIN: CRP: 1 mg/dL — ABNORMAL HIGH (ref <1.0)

## 2020-09-21 MED ORDER — INSULIN ASPART 100 UNIT/ML ~~LOC~~ SOLN
0.0000 [IU] | Freq: Every day | SUBCUTANEOUS | Status: DC
  Administered 2020-09-21: 2 [IU] via SUBCUTANEOUS
  Administered 2020-09-22: 3 [IU] via SUBCUTANEOUS
  Administered 2020-09-23: 2 [IU] via SUBCUTANEOUS

## 2020-09-21 MED ORDER — INSULIN ASPART 100 UNIT/ML ~~LOC~~ SOLN
0.0000 [IU] | Freq: Three times a day (TID) | SUBCUTANEOUS | Status: DC
  Administered 2020-09-21 (×2): 3 [IU] via SUBCUTANEOUS
  Administered 2020-09-22: 5 [IU] via SUBCUTANEOUS
  Administered 2020-09-22: 3 [IU] via SUBCUTANEOUS
  Administered 2020-09-22 – 2020-09-23 (×2): 7 [IU] via SUBCUTANEOUS
  Administered 2020-09-23: 5 [IU] via SUBCUTANEOUS
  Administered 2020-09-23 – 2020-09-24 (×2): 2 [IU] via SUBCUTANEOUS
  Administered 2020-09-24: 1 [IU] via SUBCUTANEOUS
  Administered 2020-09-24: 5 [IU] via SUBCUTANEOUS
  Administered 2020-09-25 (×2): 1 [IU] via SUBCUTANEOUS
  Administered 2020-09-25: 2 [IU] via SUBCUTANEOUS
  Administered 2020-09-26: 7 [IU] via SUBCUTANEOUS
  Administered 2020-09-26: 1 [IU] via SUBCUTANEOUS

## 2020-09-21 NOTE — Progress Notes (Signed)
PROGRESS NOTE    Howard Sawyer  KPT:[id number] DOB: 1941-01-14 DOA: 09/17/2020 PCP: Asencion Noble, MD    Brief Narrative:  Howard Sawyer is a 79 year-old male with past medical history notable for essential hypertension, hyperlipidemia, diverticulosis, prostate cancer who presented to the emergency department with progressive shortness of breath, weakness, poor appetite, cough fever.  Patient was sent in from the [facility] due to positive Covid test and hypoxia.  On EMS arrival, his oxygen saturation was noted to be in the mid 80s.  In the ED, temperature 98.5, HR 56, RR 16, BP 104/86, SPO2 low 80s on room air improved to mid 90s on 4 L nasal cannula.  Sodium 128, glucose 153, AST 228, ALT 24.  CRP 2.9, procalcitonin 0.14, D-dimer 0.3.  Chest x-ray with bilateral airspace infiltrates consistent with multifocal pneumonia.  Remdesivir and Decadron were initiated in the ED.  Hospital service was consulted for further evaluation and treatment of acute hypoxic respiratory failure secondary to Covid-19 viral pneumonia.   Assessment & Plan:   Principal Problem:   Pneumonia due to COVID-19 virus Active Problems:   Essential hypertension   Hyponatremia   Transaminitis   Acute hypoxic respiratory failure secondary to acute Covid-19 viral pneumonia during the ongoing 2020/2021 Covid 19 Pandemic - POA [facility] with complaints of progressive weakness, fatigue, shortness of breath with associated cough and diarrhea.  Covid-19 test at jail was positive.  On arrival Patient was noted to be hypoxic with oxygen saturations in the low 90s.  Elevated inflammatory markers and chest x-ray consistent with multifocal pneumonia. --COVID test: + 10/14 --CRP 2.9>1.0>1.0 --ddimer 0.83>0.68>0.39>0.44>0.37 --Remdesivir, plan 5-day course (Day #5/5) --Solumedrol 60mg  IV q12h --prone for 2-3hrs every 12hrs if able --Continue supplemental oxygen, titrate to maintain SPO2 greater  than 92%, on 5L [place] with SPO2 94%. --Continue supportive care with albuterol MDI prn, vitamin C, zinc, Tylenol, antitussives (benzonatate/ Mucinex/Tussionex) --Follow CBC, CMP, D-dimer, ferritin, and CRP daily --Ambulatory O2 saturations today --Continue airborne/contact isolation precautions for 3 weeks from the day of diagnosis  The treatment plan and use of medications and known side effects were discussed with patient/family. Some of the medications used are based on case reports/anecdotal data.  All other medications being used in the management of COVID-19 based on limited study data.  Complete risks and long-term side effects are unknown, however in the best clinical judgment they seem to be of some benefit.  Patient wanted to proceed with treatment options provided.  Hyponatremia Sodium 128 on admission. Etiology likely secondary to poor oral intake as well as diarrhea. --Na [phone] --Continue monitor BMP daily  Moderate Protein calorie malnutrition Body mass index is 24.33 kg/m. --nutrition following --Continue supplementation with Ensure 3 times daily, multivitamin  Transaminitis Etiology likely secondary to Covid-19 viral infection.  Acute hepatitis panel negative. --AST 228>183>101>82>53 --ALT 204>193>145>136>107 --Avoid hepatotoxins --Trend CMP daily  Essential hypertension --Losartan 100 mg p.o. daily --Aspirin 81 mg p.o. daily  Weakness/debility/deconditioning --PT/OT following  DVT prophylaxis: Lovenox Code Status: Full code Family Communication: Updated patient extensively at bedside  Disposition Plan:  Status is: Inpatient  Remains inpatient appropriate because:Ongoing diagnostic testing needed not appropriate for outpatient work up, Unsafe d/c plan, IV treatments appropriate due to intensity of illness or inability to take PO and Inpatient level of care appropriate due to severity of illness   Dispo: The patient is from: [facility]  Anticipated d/c is to: [facility]              Anticipated d/c date is: 3 days              Patient currently is not medically stable to d/c. Per Sheriff at bedside, jail unable to accommodate patient on oxygen, this will likely be a barrier to discharge.   Consultants:   None  Procedures:   None  Antimicrobials:   None   Subjective: Patient seen and examined at bedside, resting comfortably in bed.  Sheriff present. On 5 L nasal cannula. Continues with mild shortness of breath, cough with clear sputum.  No other complaints or concerns at this time.  Denies headache, no fever/chills/night sweats, no nausea/vomiting/diarrhea, no chest pain, no palpitations, no abdominal pain.  No acute events overnight per nursing staff.  Objective: Vitals:   09/20/20 1610 09/20/20 2122 09/21/20 0554 09/21/20 0741  BP: 112/63 (!) 109/55 110/65 (!) 114/56  Pulse: 68 67 61 66  Resp: 18 18 17 19   Temp: 98 F (36.7 C) 97.8 F (36.6 C) 98 F (36.7 C) 97.8 F (36.6 C)  TempSrc:  Oral Oral   SpO2: 92% 93% 93% 90%  Weight:      Height:        Intake/Output Summary (Last 24 hours) at 09/21/2020 1313 Last data filed at 09/20/2020 1600 Gross per 24 hour  Intake 200 ml  Output --  Net 200 ml   Filed Weights   09/17/20 0015  Weight: 72.6 kg    Examination:  General exam: Appears calm and comfortable, noted bilateral lower extremity shackles  Respiratory system: Clear to auscultation. Respiratory effort normal without accessory muscle use, on 5 L nasal cannula Cardiovascular system: S1 & S2 heard, RRR. No JVD, murmurs, rubs, gallops or clicks. No pedal edema. Gastrointestinal system: Abdomen is nondistended, soft and nontender. No organomegaly or masses felt. Normal bowel sounds heard. Central nervous system: Alert and oriented. No focal neurological deficits. Extremities: Symmetric 5 x 5 power. Skin: No rashes, lesions or ulcers Psychiatry: Judgement and insight appear  normal. Mood & affect appropriate.     Data Reviewed: I have personally reviewed following labs and imaging studies  CBC: Recent Labs  Lab 09/17/20 0128 09/18/20 0316 09/19/20 0324 09/20/20 0250 09/21/20 0409  WBC 3.4* 5.2 7.6 8.9 9.7  NEUTROABS 2.4 3.8 6.3 7.3 8.6*  HGB 14.9 15.1 14.2 14.0 13.3  HCT 42.6 43.4 41.8 42.0 39.4  MCV 88.9 88.9 89.1 89.9 89.7  PLT 163 211 268 299 371   Basic Metabolic Panel: Recent Labs  Lab 09/17/20 0128 09/18/20 0316 09/19/20 0324 09/20/20 0250 09/21/20 0409  NA 128* 135 135 137 137  K 3.5 3.9 4.1 4.4 4.7  CL 93* 101 100 100 103  CO2 27 24 26 29 26   GLUCOSE 153* 155* 181* 194* 242*  BUN 14 14 17  24* 27*  CREATININE 1.07 0.88 0.97 1.01 0.87  CALCIUM 7.8* 8.2* 8.2* 8.4* 8.2*  MG  --  2.2 2.2 2.3 2.2   GFR: Estimated Creatinine Clearance: 68.8 mL/min (by C-G formula based on SCr of 0.87 mg/dL). Liver Function Tests: Recent Labs  Lab 09/17/20 0128 09/18/20 0316 09/19/20 0324 09/20/20 0250 09/21/20 0409  AST 228* 183* 101* 82* 53*  ALT 204* 193* 145* 136* 107*  ALKPHOS 63 70 63 63 66  BILITOT 0.9 0.6 0.7 0.6 0.5  PROT 6.0* 5.6* 5.3* 5.2* 4.8*  ALBUMIN 3.2* 2.8* 2.7* 2.7* 2.4*  No results for input(s): LIPASE, AMYLASE in the last 168 hours. No results for input(s): AMMONIA in the last 168 hours. Coagulation Profile: No results for input(s): INR, PROTIME in the last 168 hours. Cardiac Enzymes: No results for input(s): CKTOTAL, CKMB, CKMBINDEX, TROPONINI in the last 168 hours. BNP (last 3 results) No results for input(s): PROBNP in the last 8760 hours. HbA1C: No results for input(s): HGBA1C in the last 72 hours. CBG: Recent Labs  Lab 09/21/20 0712 09/21/20 1241  GLUCAP 233* 245*   Lipid Profile: No results for input(s): CHOL, HDL, LDLCALC, TRIG, CHOLHDL, LDLDIRECT in the last 72 hours. Thyroid Function Tests: No results for input(s): TSH, T4TOTAL, FREET4, T3FREE, THYROIDAB in the last 72 hours. Anemia Panel: No  results for input(s): VITAMINB12, FOLATE, FERRITIN, TIBC, IRON, RETICCTPCT in the last 72 hours. Sepsis Labs: Recent Labs  Lab 09/17/20 0128  PROCALCITON 0.14  LATICACIDVEN 1.8    Recent Results (from the past 240 hour(s))  Respiratory Panel by RT PCR (Flu A&B, Covid) - Nasopharyngeal Swab     Status: Abnormal   Collection Time: 09/17/20 12:19 AM   Specimen: Nasopharyngeal Swab  Result Value Ref Range Status   SARS Coronavirus 2 by RT PCR POSITIVE (A) NEGATIVE Final    Comment: RESULT CALLED TO, READ BACK BY AND VERIFIED WITH: T EASTER,RN@0253  09/17/20 MKELLY (NOTE) SARS-CoV-2 target nucleic acids are DETECTED.  SARS-CoV-2 RNA is generally detectable in upper respiratory specimens  during the acute phase of infection. Positive results are indicative of the presence of the identified virus, but do not rule out bacterial infection or co-infection with other pathogens not detected by the test. Clinical correlation with patient history and other diagnostic information is necessary to determine patient infection status. The expected result is Negative.  Fact Sheet for Patients:  PinkCheek.be  Fact Sheet for Healthcare Providers: GravelBags.it  This test is not yet approved or cleared by the [place] FDA and  has been authorized for detection and/or diagnosis of SARS-CoV-2 by FDA under an Emergency Use Authorization (EUA).  This EUA will remain in effect (meaning this test can be [place] ed) for the duration of  the COVID-19 declaration under Section 564(b)(1) of the Act, 21 U.S.C. section 360bbb-3(b)(1), unless the authorization is terminated or revoked sooner.      Influenza A by PCR NEGATIVE NEGATIVE Final   Influenza B by PCR NEGATIVE NEGATIVE Final    Comment: (NOTE) The Xpert Xpress SARS-CoV-2/FLU/RSV assay is intended as an aid in  the diagnosis of influenza from Nasopharyngeal swab specimens and  should not be  used as a sole basis for treatment. Nasal washings and  aspirates are unacceptable for Xpert Xpress SARS-CoV-2/FLU/RSV  testing.  Fact Sheet for Patients: PinkCheek.be  Fact Sheet for Healthcare Providers: GravelBags.it  This test is not yet approved or cleared by the [place] FDA and  has been authorized for detection and/or diagnosis of SARS-CoV-2 by  FDA under an Emergency Use Authorization (EUA). This EUA will remain  in effect (meaning this test can be used) for the duration of the  Covid-19 declaration under Section 564(b)(1) of the Act, 21  U.S.C. section 360bbb-3(b)(1), unless the authorization is  terminated or revoked. Performed at [facility], [address] [place], [place] [address]   Blood Culture (routine x 2)     Status: None (Preliminary result)   Collection Time: 09/17/20  1:18 AM   Specimen: BLOOD  Result Value Ref Range Status   Specimen Description BLOOD  Final  Special Requests NONE  Final   Culture   Final    NO GROWTH 4 DAYS Performed at [facility], [address] [place], [place] [address]    Report Status PENDING  Incomplete  Blood Culture (routine x 2)     Status: None (Preliminary result)   Collection Time: 09/17/20  1:21 AM   Specimen: BLOOD  Result Value Ref Range Status   Specimen Description BLOOD RIGHT ANTECUBITAL  Final   Special Requests   Final    BOTTLES DRAWN AEROBIC AND ANAEROBIC Blood Culture adequate volume   Culture   Final    NO GROWTH 4 DAYS Performed at [facility], [address] [place], [place] [address]    Report Status PENDING  Incomplete         Radiology Studies: No results found.      Scheduled Meds: . vitamin C  500 mg Oral Daily  . aspirin EC  81 mg Oral Daily  . enoxaparin (LOVENOX) injection  40 mg Subcutaneous Q24H  . feeding supplement  237 mL Oral TID BM  . insulin aspart  0-5 Units Subcutaneous QHS  . insulin aspart  0-9 Units  Subcutaneous TID WC  . losartan  100 mg Oral Daily  . methylPREDNISolone (SOLU-MEDROL) injection  60 mg Intravenous Q12H  . multivitamin with minerals  1 tablet Oral Daily  . zinc sulfate  220 mg Oral Daily   Continuous Infusions:    LOS: 4 days    Time spent: 36 minutes spent on chart review, discussion with nursing staff, consultants, updating family and interview/physical exam; more than 50% of that time was spent in counseling and/or coordination of care.    Bernd Crom J [place] DO [facility] Available via Epic secure chat 7am-7pm After these hours, please refer to coverage provider listed on [email or web address] 09/21/2020, 1:13 PM

## 2020-09-21 NOTE — Progress Notes (Signed)
Please see in addition to PT note:   SATURATION QUALIFICATIONS: (This note is used to comply with regulatory documentation for home oxygen)  Patient Saturations on Room Air at Rest = 85%  Patient Saturations on Room Air while Ambulating = DNT due to significant and rapid desat at rest on room air   Patient Saturations on 2 Liters of oxygen while Ambulating = O2 sats at rest on 2LPM were 86%, required 4LPM for gait/activity and even then desatted to 84-86% (able to recover to 88-90% with seated rest on 4LPM)   Please briefly explain why patient needs home oxygen:rapid and sustained desaturation on room air at rest and with activity   Windell Norfolk, DPT, PN1   Supplemental Physical Therapist [place]    Pager [phone] Acute Rehab Office [phone]

## 2020-09-21 NOTE — Progress Notes (Signed)
Patient rested well overnight. No acute events occurred. Maintained 02 saturation of 95-98% on 4-5L [place].

## 2020-09-21 NOTE — Progress Notes (Signed)
Physical Therapy Treatment Patient Details Name: Howard Sawyer MRN: [id number] DOB: [birth year] Today's Date: 09/21/2020    History of Present Illness 79 yo male admitted to ED on 10/14 from [facility] jail for covid PNA. PMH includes HTN, HLD, prostate cancer.    PT Comments    Patient received in bed, pleasant and cooperative during session. Able to mobilize on a I-S level with RW. Continues to require 4LPM for activity and even then continues to desaturate down to 84-86% with gait on 4LPM, DOE 1/4 and generally mostly asymptomatic with desaturations today. Tolerated progressed gait distance but fatigued. Left up in recliner with all needs met, guard stationed outside of door. Will continue to follow acutely.    Follow Up Recommendations  Supervision for mobility/OOB     Equipment Recommendations  Other (comment) (TBD)    Recommendations for Other Services       Precautions / Restrictions Precautions Precautions: Fall Precaution Comments: watch sats Restrictions Weight Bearing Restrictions: No    Mobility  Bed Mobility Overal bed mobility: Independent Bed Mobility: Supine to Sit     Supine to sit: Independent     General bed mobility comments: no physical assist given  Transfers Overall transfer level: Needs assistance Equipment used: Rolling walker (2 wheeled) Transfers: Sit to/from Stand Sit to Stand: Supervision         General transfer comment: S for safety, cues for hand placement but safe and steady with no signs of significant functional weakness  Ambulation/Gait Ambulation/Gait assistance: Supervision Gait Distance (Feet): 120 Feet Assistive device: Rolling walker (2 wheeled) Gait Pattern/deviations: Step-through pattern;Decreased stride length;Trunk flexed Gait velocity: decr   General Gait Details: stride length limited by shackles; S for safety and cues for PLB during gait. SpO2 on 4LPM fluctuated between 84-86% on 4LPM with activity,  DOE 1/4.   Stairs             Wheelchair Mobility    Modified Rankin (Stroke Patients Only)       Balance Overall balance assessment: Needs assistance Sitting-balance support: No upper extremity supported;Feet unsupported Sitting balance-[name] Scale: Normal       Standing balance-[name] Scale: Fair Standing balance comment: able to stand and ambulate without AD, but does not accept challenge                            Cognition Arousal/Alertness: Awake/alert Behavior During Therapy: WFL for tasks assessed/performed Overall Cognitive Status: Within Functional Limits for tasks assessed                                        Exercises      General Comments General comments (skin integrity, edema, etc.): 4LPM O2, see sat note for further details      Pertinent Vitals/Pain Pain Assessment: No/denies pain    Home Living                      Prior Function            PT Goals (current goals can now be found in the care plan section) Acute Rehab PT Goals Patient Stated Goal: feel stronger PT Goal Formulation: With patient Time For Goal Achievement: 10/02/20 Potential to Achieve Goals: Good Progress towards PT goals: Progressing toward goals    Frequency    Min 3X/week  PT Plan Current plan remains appropriate    Co-evaluation              AM-PAC PT "6 Clicks" Mobility   Outcome Measure  Help needed turning from your back to your side while in a flat bed without using bedrails?: None Help needed moving from lying on your back to sitting on the side of a flat bed without using bedrails?: None Help needed moving to and from a bed to a chair (including a wheelchair)?: A Little Help needed standing up from a chair using your arms (e.g., wheelchair or bedside chair)?: A Little Help needed to walk in hospital room?: A Little Help needed climbing 3-5 steps with a railing? : A Little 6 Click Score: 20     End of Session Equipment Utilized During Treatment: Oxygen Activity Tolerance: Patient tolerated treatment well Patient left: in chair;with call bell/phone within reach (guard outside of door) Nurse Communication: Mobility status       Time: 4599-7741 PT Time Calculation (min) (ACUTE ONLY): 23 min  Charges:  $Gait Training: 8-22 mins $Therapeutic Activity: 8-22 mins                     Windell Norfolk, DPT, PN1   Supplemental Physical Therapist [place]    Pager [phone] Acute Rehab Office [phone]

## 2020-09-22 DIAGNOSIS — U071 COVID-19: Secondary | ICD-10-CM | POA: Diagnosis not present

## 2020-09-22 DIAGNOSIS — J1282 Pneumonia due to coronavirus disease 2019: Secondary | ICD-10-CM | POA: Diagnosis not present

## 2020-09-22 LAB — CULTURE, BLOOD (ROUTINE X 2)
Culture: NO GROWTH
Culture: NO GROWTH
Special Requests: ADEQUATE

## 2020-09-22 LAB — D-DIMER, QUANTITATIVE: D-Dimer, Quant: 0.36 ug/mL-FEU (ref 0.00–0.50)

## 2020-09-22 LAB — CBC WITH DIFFERENTIAL/PLATELET
Abs Immature Granulocytes: 0.15 10*3/uL — ABNORMAL HIGH (ref 0.00–0.07)
Basophils Absolute: 0 10*3/uL (ref 0.0–0.1)
Basophils Relative: 0 %
Eosinophils Absolute: 0.5 10*3/uL (ref 0.0–0.5)
Eosinophils Relative: 4 %
HCT: 42.6 % (ref 39.0–52.0)
Hemoglobin: 14.7 g/dL (ref 13.0–17.0)
Immature Granulocytes: 1 %
Lymphocytes Relative: 4 %
Lymphs Abs: 0.6 10*3/uL — ABNORMAL LOW (ref 0.7–4.0)
MCH: 31 pg (ref 26.0–34.0)
MCHC: 34.5 g/dL (ref 30.0–36.0)
MCV: 89.9 fL (ref 80.0–100.0)
Monocytes Absolute: 0.6 10*3/uL (ref 0.1–1.0)
Monocytes Relative: 4 %
Neutro Abs: 12 10*3/uL — ABNORMAL HIGH (ref 1.7–7.7)
Neutrophils Relative %: 87 %
Platelets: 304 10*3/uL (ref 150–400)
RBC: 4.74 MIL/uL (ref 4.22–5.81)
RDW: 12.6 % (ref 11.5–15.5)
WBC: 13.8 10*3/uL — ABNORMAL HIGH (ref 4.0–10.5)
nRBC: 0 % (ref 0.0–0.2)

## 2020-09-22 LAB — COMPREHENSIVE METABOLIC PANEL
ALT: 96 U/L — ABNORMAL HIGH (ref 0–44)
AST: 31 U/L (ref 15–41)
Albumin: 2.7 g/dL — ABNORMAL LOW (ref 3.5–5.0)
Alkaline Phosphatase: 55 U/L (ref 38–126)
Anion gap: 11 (ref 5–15)
BUN: 24 mg/dL — ABNORMAL HIGH (ref 8–23)
CO2: 25 mmol/L (ref 22–32)
Calcium: 8.3 mg/dL — ABNORMAL LOW (ref 8.9–10.3)
Chloride: 101 mmol/L (ref 98–111)
Creatinine, Ser: 0.97 mg/dL (ref 0.61–1.24)
GFR, Estimated: >60 mL/min (ref >60)
Glucose, Bld: 193 mg/dL — ABNORMAL HIGH (ref 70–99)
Potassium: 4.2 mmol/L (ref 3.5–5.1)
Sodium: 137 mmol/L (ref 135–145)
Total Bilirubin: 0.7 mg/dL (ref 0.3–1.2)
Total Protein: 5.1 g/dL — ABNORMAL LOW (ref 6.5–8.1)

## 2020-09-22 LAB — C-REACTIVE PROTEIN: CRP: <0.5 mg/dL (ref <1.0)

## 2020-09-22 LAB — MAGNESIUM: Magnesium: 2.1 mg/dL (ref 1.7–2.4)

## 2020-09-22 LAB — GLUCOSE, CAPILLARY
Glucose-Capillary: 222 mg/dL — ABNORMAL HIGH (ref 70–99)
Glucose-Capillary: 318 mg/dL — ABNORMAL HIGH (ref 70–99)
Glucose-Capillary: 281 mg/dL — ABNORMAL HIGH (ref 70–99)
Glucose-Capillary: 267 mg/dL — ABNORMAL HIGH (ref 70–99)

## 2020-09-22 MED ORDER — INFLUENZA VAC A&B SA ADJ QUAD 0.5 ML IM PRSY
0.5000 mL | PREFILLED_SYRINGE | INTRAMUSCULAR | Status: DC | PRN
  Filled 2020-09-22: qty 0.5

## 2020-09-22 MED ORDER — METHYLPREDNISOLONE SODIUM SUCC 40 MG IJ SOLR
40.0000 mg | Freq: Two times a day (BID) | INTRAMUSCULAR | Status: DC
  Administered 2020-09-22: 40 mg via INTRAVENOUS
  Filled 2020-09-22: qty 1

## 2020-09-22 MED ORDER — INSULIN DETEMIR 100 UNIT/ML ~~LOC~~ SOLN
8.0000 [IU] | Freq: Every day | SUBCUTANEOUS | Status: DC
  Administered 2020-09-22 – 2020-09-23 (×2): 8 [IU] via SUBCUTANEOUS
  Filled 2020-09-22 (×5): qty 0.08

## 2020-09-22 NOTE — Progress Notes (Signed)
Pt O2 sats decreased to 79% on 5lNC [place] when ambulating from bed-chair. Pt increased to 7l and encoraged to breathe-O2 sats increased to 87% and then after a minute 88-90%.

## 2020-09-22 NOTE — Progress Notes (Signed)
Occupational Therapy Treatment Patient Details Name: Howard Sawyer MRN: [id number] DOB: [birth year] Today's Date: 09/22/2020    History of present illness 79 yo male admitted to ED on 10/14 from [facility] jail for covid PNA. PMH includes HTN, HLD, prostate cancer.   OT comments  Pt noted with increased O2 demands today (7 L O2 at rest). Pt pleasant and motivated to walk. Increased to 8 L O2 for mobility using RW with desats to 70-80% though pt endorsed only mild SOB. Pt able to demo grooming tasks standing at sink for short period. Instructed pt in pursed lip breathing and other energy conservation strategies with pt verbalizing understanding. Plan to further progress activity tolerance with ADLs and improve pt independence in monitoring SpO2 during functional tasks.    Follow Up Recommendations  No OT follow up    Equipment Recommendations  Other (comment) (RW)    Recommendations for Other Services      Precautions / Restrictions Precautions Precautions: Fall Precaution Comments: watch sats Restrictions Weight Bearing Restrictions: No       Mobility Bed Mobility               General bed mobility comments: in recliner on entry  Transfers Overall transfer level: Needs assistance Equipment used: Rolling walker (2 wheeled) Transfers: Sit to/from [employer] Transfers Sit to Stand: Supervision Stand pivot transfers: Supervision       General transfer comment: Supervision for safety, no physical assist needed only intermittent cues for RW use    Balance Overall balance assessment: Needs assistance Sitting-balance support: No upper extremity supported;Feet unsupported Sitting balance-[name] Scale: Normal     Standing balance support: Bilateral upper extremity supported;During functional activity Standing balance-[name] Scale: Fair                             ADL either performed or assessed with clinical judgement   ADL Overall ADL's  : Needs assistance/impaired Eating/Feeding: Set up;Sitting Eating/Feeding Details (indicate cue type and reason): setup for lunch, assistance opening containers Grooming: Supervision/safety;Standing;Brushing hair Grooming Details (indicate cue type and reason): Supervision to brush hair standing at sink with RW                             Functional mobility during ADLs: Supervision/safety;Rolling walker;Cueing for sequencing General ADL Comments: Pt with increasing O2 demands with asymptomatic desats     Vision   Vision Assessment?: No apparent visual deficits   Perception     Praxis      Cognition Arousal/Alertness: Awake/alert Behavior During Therapy: Flat affect Overall Cognitive Status: No family/caregiver present to determine baseline cognitive functioning                                 General Comments: Pt pleasant with flat affect, able to follow all directions but seemed to have slow processing and problem solving         Exercises     Shoulder Instructions       General Comments Pt received on 7 L O2, upper 80s at rest. Increased to 8 L O2 for mobility to sink, to door and back to chair. Desats to 79-80% on 8 L O2, 1/4 DOE and endorsed fatigue. Required cues for pursed lip breathing and at least 2 min to recover to 86%. SpO2 no higher than 86%  on 7 L O2 at rest    Pertinent Vitals/ Pain       Pain Assessment: No/denies pain  Home Living                                          Prior Functioning/Environment              Frequency  Min 2X/week        Progress Toward Goals  OT Goals(current goals can now be found in the care plan section)  Progress towards OT goals: Progressing toward goals  Acute Rehab OT Goals Patient Stated Goal: feel stronger OT Goal Formulation: With patient Time For Goal Achievement: 10/02/20 Potential to Achieve Goals: Good ADL Goals Pt Will Perform Grooming: with modified  independence;standing;sitting Pt Will Transfer to Toilet: with modified independence;ambulating;regular height toilet Pt Will Perform Toileting - Clothing Manipulation and hygiene: with modified independence;sitting/lateral leans;sit to/from stand Additional ADL Goal #1: Pt will independently verbalize three energy conservation techniques for ADLs and IADLs Additional ADL Goal #2: Pt will independently monitor SpO2 and use purse lip breathing for ADLs  Plan Discharge plan remains appropriate    Co-evaluation                 AM-PAC OT "6 Clicks" Daily Activity     Outcome Measure   Help from another person eating meals?: A Little Help from another person taking care of personal grooming?: A Little Help from another person toileting, which includes using toliet, bedpan, or urinal?: A Little Help from another person bathing (including washing, rinsing, drying)?: A Little Help from another person to put on and taking off regular upper body clothing?: A Little Help from another person to put on and taking off regular lower body clothing?: A Little 6 Click Score: 18    End of Session Equipment Utilized During Treatment: Gait belt;Rolling walker;Oxygen  OT Visit Diagnosis: Unsteadiness on feet (R26.81);Other abnormalities of gait and mobility (R26.89);Muscle weakness (generalized) (M62.81)   Activity Tolerance Patient tolerated treatment well   Patient Left in chair;with call bell/phone within reach   Nurse Communication Mobility status;Other (comment) (O2)        Time: 2060-1561 OT Time Calculation (min): 23 min  Charges: OT General Charges $OT Visit: 1 Visit OT Treatments $Self Care/Home Management : 8-22 mins $Therapeutic Activity: 8-22 mins  Layla Maw, OTR/L   Layla Maw 09/22/2020, 1:25 PM

## 2020-09-22 NOTE — Progress Notes (Signed)
Howard Sawyer [id number] Admission Data: 09/22/2020 3:52 PM Attending Provider: [place] Eric J, DO  PPH:KFEXM, Carloyn Manner, MD Consults/ Treatment Team:   Howard Sawyer is a 79 y.o. male patient transferred from 2W, awake, alert  & orientated  X 4,  Full Code, VSS - Blood pressure 123/62, pulse 85, temperature (!) 97.3 F (36.3 C), temperature source Axillary, resp. rate 20, height 5\' 8"  (1.727 m), weight 72.6 kg, SpO2 (!) 89 %., O2 12 L nasal cannular, no c/o shortness of breath, no c/o chest pain, no distress noted.    Pt orientation to unit, room and routine. Information packet given to patient/family and safety video watched.  Admission INP armband ID verified with patient/family, and in place. SR up x 2, fall risk assessment complete with Patient and family verbalizing understanding of risks associated with falls. Pt verbalizes an understanding of how to use the call bell and to call for help before getting out of bed.  Skin, clean-dry- intact without evidence of bruising, or skin tears.     Will continue to monitor and assist as needed.  Hiram Comber, RN 09/22/2020 3:52 PM

## 2020-09-22 NOTE — Progress Notes (Signed)
Inpatient Diabetes Program Recommendations  AACE/ADA: New Consensus Statement on Inpatient Glycemic Control (2015)  Target Ranges:  Prepandial:   less than 140 mg/dL      Peak postprandial:   less than 180 mg/dL (1-2 hours)      Critically ill patients:  140 - 180 mg/dL   Lab Results  Component Value Date   GLUCAP 318 (H) 09/22/2020   HGBA1C 5.9 (H) 09/21/2020    Review of Glycemic Control Results for CHISOM, AUST (MRN [id number]) as of 09/22/2020 14:58  Ref. Range 09/21/2020 16:59 09/21/2020 21:08 09/22/2020 08:02 09/22/2020 12:32  Glucose-Capillary Latest Ref Range: 70 - 99 mg/dL 225 (H) 218 (H) 222 (H) 318 (H)   Diabetes history: None Outpatient Diabetes medications: None Current orders for Inpatient glycemic control:  Novolog sensitive tid with meals and HS Solumedrol 40 mg IV q 12 hours  Inpatient Diabetes Program Recommendations:    No previous history of DM.  Consider adding Levemir 8 units bid while on steroids.  Also may consider adding Novolog meal coverage 3 units tid with meals (hold if patient eats less than 50%).   Thanks  Adah Perl, RN, BC-ADM Inpatient Diabetes Coordinator Pager [phone] (8a-5p)

## 2020-09-22 NOTE — Plan of Care (Signed)
  Problem: Education: Goal: Knowledge of risk factors and measures for prevention of condition will improve Outcome: Progressing   Problem: Coping: Goal: Psychosocial and spiritual needs will be supported Outcome: Progressing   Problem: Respiratory: Goal: Will maintain a patent airway Outcome: Progressing Goal: Complications related to the disease process, condition or treatment will be avoided or minimized Outcome: Progressing   Problem: Education: Goal: Knowledge of risk factors and measures for prevention of condition will improve Outcome: Progressing   Problem: Coping: Goal: Psychosocial and spiritual needs will be supported Outcome: Progressing   Problem: Respiratory: Goal: Will maintain a patent airway Outcome: Progressing Goal: Complications related to the disease process, condition or treatment will be avoided or minimized Outcome: Progressing   Problem: Activity: Goal: Ability to tolerate increased activity will improve Outcome: Progressing   Problem: Clinical Measurements: Goal: Ability to maintain a body temperature in the normal range will improve Outcome: Progressing   Problem: Respiratory: Goal: Ability to maintain adequate ventilation will improve Outcome: Progressing Goal: Ability to maintain a clear airway will improve Outcome: Progressing   Problem: Education: Goal: Knowledge of General Education information will improve Description: Including pain rating scale, medication(s)/side effects and non-pharmacologic comfort measures Outcome: Progressing   Problem: Health Behavior/Discharge Planning: Goal: Ability to manage health-related needs will improve Outcome: Progressing   Problem: Clinical Measurements: Goal: Respiratory complications will improve Outcome: Progressing

## 2020-09-22 NOTE — Progress Notes (Signed)
Patient experiences a decrease in oxygenation when out of bed. Currently remains on 4-5 L at rest.Significant dyspnea with any exertion. Patient has no further complaints at this time.

## 2020-09-22 NOTE — Progress Notes (Addendum)
PROGRESS NOTE    Howard Sawyer  VVO:[id number] DOB: 28-Dec-1940 DOA: 09/17/2020 PCP: Asencion Noble, MD    Brief Narrative:  Howard Sawyer is a 79 year-old male with past medical history notable for essential hypertension, hyperlipidemia, diverticulosis, prostate cancer who presented to the emergency department with progressive shortness of breath, weakness, poor appetite, cough fever.  Patient was sent in from the [facility] due to positive Covid test and hypoxia.  On EMS arrival, his oxygen saturation was noted to be in the mid 80s.  In the ED, temperature 98.5, HR 56, RR 16, BP 104/86, SPO2 low 80s on room air improved to mid 90s on 4 L nasal cannula.  Sodium 128, glucose 153, AST 228, ALT 24.  CRP 2.9, procalcitonin 0.14, D-dimer 0.3.  Chest x-ray with bilateral airspace infiltrates consistent with multifocal pneumonia.  Remdesivir and Decadron were initiated in the ED.  Hospital service was consulted for further evaluation and treatment of acute hypoxic respiratory failure secondary to Covid-19 viral pneumonia.   Assessment & Plan:   Principal Problem:   Pneumonia due to COVID-19 virus Active Problems:   Essential hypertension   Hyponatremia   Transaminitis   Acute hypoxic respiratory failure secondary to acute Covid-19 viral pneumonia during the ongoing 2020/2021 Covid 19 Pandemic - POA [facility] from jail with complaints of progressive weakness, fatigue, shortness of breath with associated cough and diarrhea.  Covid-19 test at jail was positive.  On arrival Patient was noted to be hypoxic with oxygen saturations in the low 90s.  Elevated inflammatory markers and chest x-ray consistent with multifocal pneumonia. --COVID test: + 10/14 --CRP 2.9>1.0>1.0 --ddimer 0.83>0.68>0.39>0.44>0.37, <0.5 --Completed 5-day course of remdesivir on 09/21/2020 --Decrease Solumedrol to 40mg  IV q12h --prone for 2-3hrs every 12hrs if able --Continue supplemental oxygen, titrate  to maintain SPO2 greater than 92%, on 5L  with SPO2 91%. --Continue supportive care with albuterol MDI prn, vitamin C, zinc, Tylenol, antitussives (benzonatate/ Mucinex/Tussionex) --Follow CBC, CMP, D-dimer, ferritin, and CRP daily --Ambulatory O2 saturations today --Continue airborne/contact isolation precautions for 3 weeks from the day of diagnosis  The treatment plan and use of medications and known side effects were discussed with patient/family. Some of the medications used are based on case reports/anecdotal data.  All other medications being used in the management of COVID-19 based on limited study data.  Complete risks and long-term side effects are unknown, however in the best clinical judgment they seem to be of some benefit.  Patient wanted to proceed with treatment options provided.  Hyponatremia Sodium 128 on admission. Etiology likely secondary to poor oral intake as well as diarrhea. --Na 128>135>141>137 --Continue monitor BMP daily  Hyperglycemia Hemoglobin A1c 5.9. Likely induced by IV steriods. --Levemir 8u [place] daily --ISS --CBGs qAC/HS  Moderate Protein calorie malnutrition Body mass index is 24.33 kg/m. --nutrition following --Continue supplementation with Ensure 3 times daily, multivitamin  Transaminitis Etiology likely secondary to Covid-19 viral infection.  Acute hepatitis panel negative. --AST 228>183>101>82>53>31 --ALT 204>193>145>136>107>96 --Avoid hepatotoxins --Trend CMP daily  Essential hypertension --Losartan 100 mg p.o. daily --Aspirin 81 mg p.o. daily  Weakness/debility/deconditioning --PT/OT following  DVT prophylaxis: Lovenox Code Status: Full code Family Communication: Updated patient extensively at bedside  Disposition Plan:  Status is: Inpatient  Remains inpatient appropriate because:Ongoing diagnostic testing needed not appropriate for outpatient work up, Unsafe d/c plan, IV treatments appropriate due to intensity of illness or  inability to take PO and Inpatient level of care appropriate due to severity of illness  Dispo: The patient is from: [facility]              Anticipated d/c is to: [facility]              Anticipated d/c date is: 3 days              Patient currently is not medically stable to d/c. Per Sheriff at bedside, jail unable to accommodate patient on oxygen, this will be a barrier to discharge.   Consultants:   None  Procedures:   None  Antimicrobials:   None   Subjective: Patient seen and examined at bedside, patient resting comfortably in bedside chair watching television.  Continues on 5 L nasal cannula, difficult to wean further.  Patient with significant desaturations with minimal exertion/ambulation throughout the room.  Sheriff present at bedside; unable to accommodate patient at correctional institution with oxygen currently.  Patient continues with nonproductive cough.  No other complaints or concerns at this time. Denies headache, no fever/chills/night sweats, no nausea/vomiting/diarrhea, no chest pain, no palpitations, no abdominal pain.  No acute events overnight per nursing staff.  Objective: Vitals:   09/21/20 0554 09/21/20 0741 09/21/20 2000 09/22/20 0940  BP: 110/65 (!) 114/56 135/65 137/69  Pulse: 61 66 87 94  Resp: 17 19 20    Temp: 98 F (36.7 C) 97.8 F (36.6 C) 97.9 F (36.6 C)   TempSrc: Oral  Oral   SpO2: 93% 90% 91% 91%  Weight:      Height:       No intake or output data in the 24 hours ending 09/22/20 1110 Filed Weights   09/17/20 0015  Weight: 72.6 kg    Examination:  General exam: Appears calm and comfortable, noted bilateral lower extremity shackles  Respiratory system: Clear to auscultation. Respiratory effort normal without accessory muscle use, on 5 L nasal cannula Cardiovascular system: S1 & S2 heard, RRR. No JVD, murmurs, rubs, gallops or clicks. No pedal edema. Gastrointestinal system: Abdomen is nondistended, soft  and nontender. No organomegaly or masses felt. Normal bowel sounds heard. Central nervous system: Alert and oriented. No focal neurological deficits. Extremities: Symmetric 5 x 5 power. Skin: No rashes, lesions or ulcers Psychiatry: Judgement and insight appear normal. Mood & affect appropriate.     Data Reviewed: I have personally reviewed following labs and imaging studies  CBC: Recent Labs  Lab 09/18/20 0316 09/19/20 0324 09/20/20 0250 09/21/20 0409 09/22/20 0054  WBC 5.2 7.6 8.9 9.7 13.8*  NEUTROABS 3.8 6.3 7.3 8.6* 12.0*  HGB 15.1 14.2 14.0 13.3 14.7  HCT 43.4 41.8 42.0 39.4 42.6  MCV 88.9 89.1 89.9 89.7 89.9  PLT 211 268 299 310 001   Basic Metabolic Panel: Recent Labs  Lab 09/18/20 0316 09/19/20 0324 09/20/20 0250 09/21/20 0409 09/22/20 0054  NA 135 135 137 137 137  K 3.9 4.1 4.4 4.7 4.2  CL 101 100 100 103 101  CO2 24 26 29 26 25   GLUCOSE 155* 181* 194* 242* 193*  BUN 14 17 24* 27* 24*  CREATININE 0.88 0.97 1.01 0.87 0.97  CALCIUM 8.2* 8.2* 8.4* 8.2* 8.3*  MG 2.2 2.2 2.3 2.2 2.1   GFR: Estimated Creatinine Clearance: 61.7 mL/min (by C-G formula based on SCr of 0.97 mg/dL). Liver Function Tests: Recent Labs  Lab 09/18/20 0316 09/19/20 0324 09/20/20 0250 09/21/20 0409 09/22/20 0054  AST 183* 101* 82* 53* 31  ALT 193* 145* 136* 107* 96*  ALKPHOS 70 63 63 66 55  BILITOT 0.6 0.7 0.6 0.5 0.7  PROT 5.6* 5.3* 5.2* 4.8* 5.1*  ALBUMIN 2.8* 2.7* 2.7* 2.4* 2.7*   No results for input(s): LIPASE, AMYLASE in the last 168 hours. No results for input(s): AMMONIA in the last 168 hours. Coagulation Profile: No results for input(s): INR, PROTIME in the last 168 hours. Cardiac Enzymes: No results for input(s): CKTOTAL, CKMB, CKMBINDEX, TROPONINI in the last 168 hours. BNP (last 3 results) No results for input(s): PROBNP in the last 8760 hours. HbA1C: Recent Labs    09/21/20 0710  HGBA1C 5.9*   CBG: Recent Labs  Lab 09/21/20 0712 09/21/20 1241  09/21/20 1659 09/21/20 2108 09/22/20 0802  GLUCAP 233* 245* 225* 218* 222*   Lipid Profile: No results for input(s): CHOL, HDL, LDLCALC, TRIG, CHOLHDL, LDLDIRECT in the last 72 hours. Thyroid Function Tests: No results for input(s): TSH, T4TOTAL, FREET4, T3FREE, THYROIDAB in the last 72 hours. Anemia Panel: No results for input(s): VITAMINB12, FOLATE, FERRITIN, TIBC, IRON, RETICCTPCT in the last 72 hours. Sepsis Labs: Recent Labs  Lab 09/17/20 0128  PROCALCITON 0.14  LATICACIDVEN 1.8    Recent Results (from the past 240 hour(s))  Respiratory Panel by RT PCR (Flu A&B, Covid) - Nasopharyngeal Swab     Status: Abnormal   Collection Time: 09/17/20 12:19 AM   Specimen: Nasopharyngeal Swab  Result Value Ref Range Status   SARS Coronavirus 2 by RT PCR POSITIVE (A) NEGATIVE Final    Comment: RESULT CALLED TO, READ BACK BY AND VERIFIED WITH: T EASTER,RN@0253  09/17/20 MKELLY (NOTE) SARS-CoV-2 target nucleic acids are DETECTED.  SARS-CoV-2 RNA is generally detectable in upper respiratory specimens  during the acute phase of infection. Positive results are indicative of the presence of the identified virus, but do not rule out bacterial infection or co-infection with other pathogens not detected by the test. Clinical correlation with patient history and other diagnostic information is necessary to determine patient infection status. The expected result is Negative.  Fact Sheet for Patients:  PinkCheek.be  Fact Sheet for Healthcare Providers: GravelBags.it  This test is not yet approved or cleared by the [place] FDA and  has been authorized for detection and/or diagnosis of SARS-CoV-2 by FDA under an Emergency Use Authorization (EUA).  This EUA will remain in effect (meaning this test can be [place] ed) for the duration of  the COVID-19 declaration under Section 564(b)(1) of the Act, 21 U.S.C. section 360bbb-3(b)(1),  unless the authorization is terminated or revoked sooner.      Influenza A by PCR NEGATIVE NEGATIVE Final   Influenza B by PCR NEGATIVE NEGATIVE Final    Comment: (NOTE) The Xpert Xpress SARS-CoV-2/FLU/RSV assay is intended as an aid in  the diagnosis of influenza from Nasopharyngeal swab specimens and  should not be used as a sole basis for treatment. Nasal washings and  aspirates are unacceptable for Xpert Xpress SARS-CoV-2/FLU/RSV  testing.  Fact Sheet for Patients: PinkCheek.be  Fact Sheet for Healthcare Providers: GravelBags.it  This test is not yet approved or cleared by the [place] FDA and  has been authorized for detection and/or diagnosis of SARS-CoV-2 by  FDA under an Emergency Use Authorization (EUA). This EUA will remain  in effect (meaning this test can be used) for the duration of the  Covid-19 declaration under Section 564(b)(1) of the Act, 21  U.S.C. section 360bbb-3(b)(1), unless the authorization is  terminated or revoked. Performed at [facility], [address], [place], [place] [address]   Blood Culture (routine x 2)  Status: None   Collection Time: 09/17/20  1:18 AM   Specimen: BLOOD  Result Value Ref Range Status   Specimen Description BLOOD  Final   Special Requests NONE  Final   Culture   Final    NO GROWTH 5 DAYS Performed at [facility], [address], [place], [place] [address]    Report Status 09/22/2020 FINAL  Final  Blood Culture (routine x 2)     Status: None   Collection Time: 09/17/20  1:21 AM   Specimen: BLOOD  Result Value Ref Range Status   Specimen Description BLOOD RIGHT ANTECUBITAL  Final   Special Requests   Final    BOTTLES DRAWN AEROBIC AND ANAEROBIC Blood Culture adequate volume   Culture   Final    NO GROWTH 5 DAYS Performed at [facility], [address] [place],  [address]    Report Status 09/22/2020 FINAL  Final         Radiology  Studies: No results found.      Scheduled Meds: . vitamin C  500 mg Oral Daily  . aspirin EC  81 mg Oral Daily  . enoxaparin (LOVENOX) injection  40 mg Subcutaneous Q24H  . feeding supplement  237 mL Oral TID BM  . insulin aspart  0-5 Units Subcutaneous QHS  . insulin aspart  0-9 Units Subcutaneous TID WC  . losartan  100 mg Oral Daily  . methylPREDNISolone (SOLU-MEDROL) injection  60 mg Intravenous Q12H  . multivitamin with minerals  1 tablet Oral Daily  . zinc sulfate  220 mg Oral Daily   Continuous Infusions:    LOS: 5 days    Time spent: 36 minutes spent on chart review, discussion with nursing staff, consultants, updating family and interview/physical exam; more than 50% of that time was spent in counseling and/or coordination of care.    Loyda Costin J [place] DO [facility] Available via Epic secure chat 7am-7pm After these hours, please refer to coverage provider listed on [email or web address] 09/22/2020, 11:10 AM

## 2020-09-23 ENCOUNTER — Inpatient Hospital Stay (HOSPITAL_COMMUNITY)

## 2020-09-23 DIAGNOSIS — R7401 Elevation of levels of liver transaminase levels: Secondary | ICD-10-CM

## 2020-09-23 DIAGNOSIS — I10 Essential (primary) hypertension: Secondary | ICD-10-CM

## 2020-09-23 DIAGNOSIS — J9601 Acute respiratory failure with hypoxia: Secondary | ICD-10-CM | POA: Diagnosis not present

## 2020-09-23 DIAGNOSIS — U071 COVID-19: Secondary | ICD-10-CM | POA: Diagnosis not present

## 2020-09-23 LAB — CBC
HCT: 44.4 % (ref 39.0–52.0)
Hemoglobin: 15.4 g/dL (ref 13.0–17.0)
MCH: 31.2 pg (ref 26.0–34.0)
MCHC: 34.7 g/dL (ref 30.0–36.0)
MCV: 90.1 fL (ref 80.0–100.0)
Platelets: 404 10*3/uL — ABNORMAL HIGH (ref 150–400)
RBC: 4.93 MIL/uL (ref 4.22–5.81)
RDW: 12.7 % (ref 11.5–15.5)
WBC: 16.5 10*3/uL — ABNORMAL HIGH (ref 4.0–10.5)
nRBC: 0.2 % (ref 0.0–0.2)

## 2020-09-23 LAB — COMPREHENSIVE METABOLIC PANEL
ALT: 79 U/L — ABNORMAL HIGH (ref 0–44)
AST: 26 U/L (ref 15–41)
Albumin: 2.9 g/dL — ABNORMAL LOW (ref 3.5–5.0)
Alkaline Phosphatase: 62 U/L (ref 38–126)
Anion gap: 12 (ref 5–15)
BUN: 26 mg/dL — ABNORMAL HIGH (ref 8–23)
CO2: 25 mmol/L (ref 22–32)
Calcium: 8.9 mg/dL (ref 8.9–10.3)
Chloride: 101 mmol/L (ref 98–111)
Creatinine, Ser: 1 mg/dL (ref 0.61–1.24)
GFR, Estimated: >60 mL/min (ref >60)
Glucose, Bld: 178 mg/dL — ABNORMAL HIGH (ref 70–99)
Potassium: 4.2 mmol/L (ref 3.5–5.1)
Sodium: 138 mmol/L (ref 135–145)
Total Bilirubin: 0.7 mg/dL (ref 0.3–1.2)
Total Protein: 5.7 g/dL — ABNORMAL LOW (ref 6.5–8.1)

## 2020-09-23 LAB — GLUCOSE, CAPILLARY
Glucose-Capillary: 199 mg/dL — ABNORMAL HIGH (ref 70–99)
Glucose-Capillary: 316 mg/dL — ABNORMAL HIGH (ref 70–99)
Glucose-Capillary: 298 mg/dL — ABNORMAL HIGH (ref 70–99)
Glucose-Capillary: 236 mg/dL — ABNORMAL HIGH (ref 70–99)

## 2020-09-23 LAB — C-REACTIVE PROTEIN: CRP: 0.7 mg/dL (ref <1.0)

## 2020-09-23 LAB — D-DIMER, QUANTITATIVE: D-Dimer, Quant: 0.32 ug/mL-FEU (ref 0.00–0.50)

## 2020-09-23 MED ORDER — BARICITINIB 2 MG PO TABS
4.0000 mg | ORAL_TABLET | Freq: Every day | ORAL | Status: DC
  Administered 2020-09-23 – 2020-09-28 (×6): 4 mg via ORAL
  Filled 2020-09-23 (×6): qty 2

## 2020-09-23 MED ORDER — METHYLPREDNISOLONE SODIUM SUCC 125 MG IJ SOLR
80.0000 mg | Freq: Two times a day (BID) | INTRAMUSCULAR | Status: DC
  Administered 2020-09-23 – 2020-09-26 (×7): 80 mg via INTRAVENOUS
  Filled 2020-09-23 (×7): qty 2

## 2020-09-23 NOTE — TOC Initial Note (Signed)
Transition of Care [facility]) - Initial/Assessment Note    Patient Details  Name: Howard Sawyer MRN: [id number] Date of Birth: 1941-07-01  Transition of [facility]) CM/SW Contact:    Carles Collet, RN Phone Number: 09/23/2020, 11:05 AM  Clinical Narrative:   Patient admitted from [facility]. COVID +. Symptoms worsening, oxygen demand increasing. MD updated medical staff at jail today.         In jail since 03/07/18 [place] [phone]        DO NOT CONTACT FAMILY DIRECTLY, instead communicate to guard who can reach family.     Expected Discharge Plan: Corrections Facility Barriers to Discharge: Continued Medical Work up   Patient Goals and CMS Choice        Expected Discharge Plan and Services Expected Discharge Plan: Regulatory affairs officer                                              Prior Living Arrangements/Services                       Activities of Daily Living Home Assistive Devices/Equipment: None ADL Screening (condition at time of admission) Patient's cognitive ability adequate to safely complete daily activities?: Yes Is the patient deaf or have difficulty hearing?: No Does the patient have difficulty seeing, even when wearing glasses/contacts?: No Does the patient have difficulty concentrating, remembering, or making decisions?: No Patient able to express need for assistance with ADLs?: Yes Does the patient have difficulty dressing or bathing?: No Independently performs ADLs?: Yes (appropriate for developmental age) Does the patient have difficulty walking or climbing stairs?: No Weakness of Legs: None Weakness of Arms/Hands: None  Permission Sought/Granted                  Emotional Assessment              Admission diagnosis:  Acute respiratory failure with hypoxia ([place]) [J96.01] Pneumonia due to COVID-19 virus [U07.1, J12.82] Patient Active Problem List   Diagnosis Date Noted   . Essential hypertension 09/18/2020  . Hyponatremia 09/18/2020  . Transaminitis 09/18/2020  . Pneumonia due to COVID-19 virus 09/17/2020  . Acute respiratory failure with hypoxia ([place])   . History of colonic polyps   . Diverticulosis of colon without hemorrhage   . Adenomatous colon polyp 02/10/2012   PCP:  Asencion Noble, MD Pharmacy:   [place], [place] [address] Phone: [phone] Fax: [phone]  [place] [address], [place] [address] [place] [address] [place] [address] Phone: [phone] Fax: [phone]     Social Determinants of Health (SDOH) Interventions    Readmission Risk Interventions No flowsheet data found.

## 2020-09-23 NOTE — Progress Notes (Signed)
Pt is being placed on baricitinib and he is from jail. Ok to check quantiferon gold to r/o TB.  Onnie Boer, PharmD, BCIDP, AAHIVP, CPP Infectious Disease Pharmacist 09/23/2020 11:37 AM

## 2020-09-23 NOTE — Progress Notes (Signed)
Patient's heart rate in the 1430's. Patient up walking around. Patient had a small run of vtach and arrhthymia. Notified MD, obtained EKG.

## 2020-09-23 NOTE — Progress Notes (Signed)
PROGRESS NOTE    Howard Sawyer  YNW:[id number] DOB: 05-26-1941 DOA: 09/17/2020 PCP: Asencion Noble, MD    Brief Narrative:  Howard Sawyer is a 79 year-old male with past medical history notable for essential hypertension, hyperlipidemia, diverticulosis, prostate cancer who presented to the emergency department with progressive shortness of breath, weakness, poor appetite, cough fever.  Patient was sent in from the [facility] due to positive Covid test and hypoxia.  On EMS arrival, his oxygen saturation was noted to be in the mid 80s.  In the ED, temperature 98.5, HR 56, RR 16, BP 104/86, SPO2 low 80s on room air improved to mid 90s on 4 L nasal cannula.  Sodium 128, glucose 153, AST 228, ALT 24.  CRP 2.9, procalcitonin 0.14, D-dimer 0.3.  Chest x-ray with bilateral airspace infiltrates consistent with multifocal pneumonia.  Remdesivir and Decadron were initiated in the ED.  Hospital service was consulted for further evaluation and treatment of acute hypoxic respiratory failure secondary to Covid-19 viral pneumonia.  Subjective:  Patient with increased oxygen requirement overnight, he does report some dyspnea, denies any nausea, vomiting or diarrhea.  Assessment & Plan:   Principal Problem:   Pneumonia due to COVID-19 virus Active Problems:   Essential hypertension   Hyponatremia   Transaminitis   Acute hypoxic respiratory failure secondary to acute Covid-19 viral pneumonia during the ongoing 2020/2021 Covid 19 Pandemic  -Unfortunately patient is unvaccinated. -He does appear to be having severe COVID-19 pneumonia (present on admission. -Continue with IV Solu-Medrol, dose has increased to 80 mg every 12 hours.. -He is treated with IV remdesivir. -Given worsening respiratory failure will be started on baricitinib, patient is poor historian, he is unaware of any TB or hepatitis history, I have discussed with [facility] nurse [facility], there is no history of TB or hepatitis viral  infection the record, as well patient denies any of these diagnoses [facility]. -Was encouraged use incentive spirometry and flutter valve, out of bed to chair, and to plan once possible. -Continue to trend inflammatory markers daily. -Patient with worsening respiratory failure with significant increase in oxygen requirement, he is currently on 10 L high flow nasal cannula.  SpO2: 91 % O2 Flow Rate (L/min): (S) 10 L/min  The treatment plan and use of medications and known side effects were discussed with patient/family. Some of the medications used are based on case reports/anecdotal data.  All other medications being used in the management of COVID-19 based on limited study data.  Complete risks and long-term side effects are unknown, however in the best clinical judgment they seem to be of some benefit.  Patient wanted to proceed with treatment options provided.  Hyponatremia - resolved  Hyperglycemia Hemoglobin A1c 5.9. Likely induced by IV steriods. --Levemir 8u [place] daily --ISS --CBGs qAC/HS  Moderate Protein calorie malnutrition Body mass index is 24.33 kg/m. --nutrition following --Continue supplementation with Ensure 3 times daily, multivitamin  Transaminitis - Etiology likely secondary to Covid-19 viral infection.  Acute hepatitis panel negative. -Trending down  Essential hypertension --Losartan 100 mg p.o. daily --Aspirin 81 mg p.o. daily  Weakness/debility/deconditioning --PT/OT following  DVT prophylaxis: Lovenox Code Status: Full code Family Communication: Updated patient extensively at bedside, as well I have discussed with nurse station at [facility] [phone], as well and able to update any family members per  Correction facility policy.  Disposition Plan:  Status is: Inpatient  Remains inpatient appropriate because:Hemodynamically unstable, Unsafe d/c plan and IV treatments  appropriate due to intensity of illness or inability to  take PO   [facility]              [facility]ticipated d/c is to: [facility]              Anticipated d/c date is: 3 days              Patient currently is not medically stable to d/c.   Consultants:   None  Procedures:   None  Antimicrobials:   None     Objective: Vitals:   09/22/20 1544 09/22/20 2000 09/22/20 2100 09/23/20 0400  BP:   (!) 143/81 (!) 155/76  Pulse: 85  84 96  Resp:   20   Temp:   97.6 F (36.4 C) 98.5 F (36.9 C)  TempSrc:   Oral Oral  SpO2: (!) 89% 94% 97% 91%  Weight:      Height:        Intake/Output Summary (Last 24 hours) at 09/23/2020 1337 Last data filed at 09/23/2020 0900 Gross per 24 hour  Intake 680 ml  Output --  Net 680 ml   Filed Weights   09/17/20 0015  Weight: 72.6 kg    Examination:  Awake Alert, Oriented X 3, No new F.N deficits, Normal affect Symmetrical Chest wall movement, Good air movement bilaterally,  has mild tachypnea RRR,No Gallops,Rubs or new Murmurs, No Parasternal Heave +ve B.Sounds, Abd Soft, No tenderness, No rebound - guarding or rigidity. No Cyanosis, Clubbing or edema, No new Rash or bruise      Data Reviewed: I have personally reviewed following labs and imaging studies  CBC: Recent Labs  Lab 09/18/20 0316 09/18/20 0316 09/19/20 0324 09/20/20 0250 09/21/20 0409 09/22/20 0054 09/23/20 0124  WBC 5.2   < > 7.6 8.9 9.7 13.8* 16.5*  NEUTROABS 3.8  --  6.3 7.3 8.6* 12.0*  --   HGB 15.1   < > 14.2 14.0 13.3 14.7 15.4  HCT 43.4   < > 41.8 42.0 39.4 42.6 44.4  MCV 88.9   < > 89.1 89.9 89.7 89.9 90.1  PLT 211   < > 268 299 310 304 404*   < > = values in this interval not displayed.   Basic Metabolic Panel: Recent Labs  Lab 09/18/20 0316 09/18/20 0316 09/19/20 0324 09/20/20 0250 09/21/20 0409 09/22/20 0054 09/23/20 0124  NA 135   < > 135 137 137 137 138  K 3.9   < > 4.1 4.4 4.7 4.2 4.2  CL 101   < > 100 100 103 101 101  CO2 24   < > 26 29 26  25 25   GLUCOSE 155*   < > 181* 194* 242* 193* 178*  BUN 14   < > 17 24* 27* 24* 26*  CREATININE 0.88   < > 0.97 1.01 0.87 0.97 1.00  CALCIUM 8.2*   < > 8.2* 8.4* 8.2* 8.3* 8.9  MG 2.2  --  2.2 2.3 2.2 2.1  --    < > = values in this interval not displayed.   GFR: Estimated Creatinine Clearance: 59.9 mL/min (by C-G formula based on SCr of 1 mg/dL). Liver Function Tests: Recent Labs  Lab 09/19/20 0324 09/20/20 0250 09/21/20 0409 09/22/20 0054 09/23/20 0124  AST 101* 82* 53* 31 26  ALT 145* 136* 107* 96* 79*  ALKPHOS 63 63 66 55 62  BILITOT 0.7 0.6 0.5 0.7 0.7  PROT 5.3* 5.2* 4.8* 5.1* 5.7*  ALBUMIN 2.7* 2.7* 2.4* 2.7* 2.9*   No results for input(s): LIPASE, AMYLASE in the last 168 hours. No results for input(s): AMMONIA in the last 168 hours. Coagulation Profile: No results for input(s): INR, PROTIME in the last 168 hours. Cardiac Enzymes: No results for input(s): CKTOTAL, CKMB, CKMBINDEX, TROPONINI in the last 168 hours. BNP (last 3 results) No results for input(s): PROBNP in the last 8760 hours. HbA1C: Recent Labs    09/21/20 0710  HGBA1C 5.9*   CBG: Recent Labs  Lab 09/22/20 1232 09/22/20 1628 09/22/20 2057 09/23/20 0750 09/23/20 1215  GLUCAP 318* 281* 267* 199* 316*   Lipid Profile: No results for input(s): CHOL, HDL, LDLCALC, TRIG, CHOLHDL, LDLDIRECT in the last 72 hours. Thyroid Function Tests: No results for input(s): TSH, T4TOTAL, FREET4, T3FREE, THYROIDAB in the last 72 hours. Anemia Panel: No results for input(s): VITAMINB12, FOLATE, FERRITIN, TIBC, IRON, RETICCTPCT in the last 72 hours. Sepsis Labs: Recent Labs  Lab 09/17/20 0128  PROCALCITON 0.14  LATICACIDVEN 1.8    Recent Results (from the past 240 hour(s))  Respiratory Panel by RT PCR (Flu A&B, Covid) - Nasopharyngeal Swab     Status: Abnormal   Collection Time: 09/17/20 12:19 AM   Specimen: Nasopharyngeal Swab  Result Value Ref Range Status   SARS Coronavirus 2 by RT PCR POSITIVE (A)  NEGATIVE Final    Comment: RESULT CALLED TO, READ BACK BY AND VERIFIED WITH: T EASTER,RN@[id number]  09/17/20 MKELLY (NOTE) SARS-CoV-2 target nucleic acids are DETECTED.  SARS-CoV-2 RNA is generally detectable in upper respiratory specimens  during the acute phase of infection. Positive results are indicative of the presence of the identified virus, but do not rule out bacterial infection or co-infection with other pathogens not detected by the test. Clinical correlation with patient history and other diagnostic information is necessary to determine patient infection status. The expected result is Negative.  Fact Sheet for Patients:  PinkCheek.be  Fact Sheet for Healthcare Providers: GravelBags.it  This test is not yet approved or cleared by the [place] FDA and  has been authorized for detection and/or diagnosis of SARS-CoV-2 by FDA under an Emergency Use Authorization (EUA).  This EUA will remain in effect (meaning this test can be [place] ed) for the duration of  the COVID-19 declaration under Section 564(b)(1) of the Act, 21 U.S.C. section 360bbb-3(b)(1), unless the authorization is terminated or revoked sooner.      Influenza A by PCR NEGATIVE NEGATIVE Final   Influenza B by PCR NEGATIVE NEGATIVE Final    Comment: (NOTE) The Xpert Xpress SARS-CoV-2/FLU/RSV assay is intended as an aid in  the diagnosis of influenza from Nasopharyngeal swab specimens and  should not be used as a sole basis for treatment. Nasal washings and  aspirates are unacceptable for Xpert Xpress SARS-CoV-2/FLU/RSV  testing.  Fact Sheet for Patients: PinkCheek.be  Fact Sheet for Healthcare Providers: GravelBags.it  This test is not yet approved or cleared by the [place] FDA and  has been authorized for detection and/or diagnosis of SARS-CoV-2 by  FDA under an Emergency Use  Authorization (EUA). This EUA will remain  in effect (meaning this test can be used) for the duration of the  Covid-19 declaration under Section 564(b)(1) of the Act, 21  U.S.C. section 360bbb-3(b)(1), unless the authorization is  terminated or revoked. Performed at [facility], [address] [place], [place] [address]   Blood Culture (routine x 2)     Status: None  Collection Time: 09/17/20  1:18 AM   Specimen: BLOOD  Result Value Ref Range Status   Specimen Description BLOOD  Final   Special Requests NONE  Final   Culture   Final    NO GROWTH 5 DAYS Performed at [facility], [address] [place], [place] [address]    Report Status 09/22/2020 FINAL  Final  Blood Culture (routine x 2)     Status: None   Collection Time: 09/17/20  1:21 AM   Specimen: BLOOD  Result Value Ref Range Status   Specimen Description BLOOD RIGHT ANTECUBITAL  Final   Special Requests   Final    BOTTLES DRAWN AEROBIC AND ANAEROBIC Blood Culture adequate volume   Culture   Final    NO GROWTH 5 DAYS Performed at [facility], [address] [place], [place] [address]    Report Status 09/22/2020 FINAL  Final         Radiology Studies: No results found.      Scheduled Meds: . vitamin C  500 mg Oral Daily  . aspirin EC  81 mg Oral Daily  . baricitinib  4 mg Oral Daily  . enoxaparin (LOVENOX) injection  40 mg Subcutaneous Q24H  . feeding supplement  237 mL Oral TID BM  . insulin aspart  0-5 Units Subcutaneous QHS  . insulin aspart  0-9 Units Subcutaneous TID WC  . insulin detemir  8 Units Subcutaneous Daily  . losartan  100 mg Oral Daily  . methylPREDNISolone (SOLU-MEDROL) injection  80 mg Intravenous Q12H  . multivitamin with minerals  1 tablet Oral Daily  . zinc sulfate  220 mg Oral Daily   Continuous Infusions:    LOS: 6 days      Phillips Climes, MD [facility] Please refer to coverage provider listed on [email or web address] 09/23/2020, 1:37 PM

## 2020-09-23 NOTE — Plan of Care (Signed)
  Problem: Respiratory: Goal: Will maintain a patent airway Outcome: Progressing Goal: Complications related to the disease process, condition or treatment will be avoided or minimized Outcome: Progressing   Problem: Respiratory: Goal: Will maintain a patent airway Outcome: Progressing Goal: Complications related to the disease process, condition or treatment will be avoided or minimized Outcome: Progressing   Problem: Activity: Goal: Ability to tolerate increased activity will improve Outcome: Progressing   Problem: Clinical Measurements: Goal: Ability to maintain a body temperature in the normal range will improve Outcome: Progressing   Problem: Respiratory: Goal: Ability to maintain adequate ventilation will improve Outcome: Progressing Goal: Ability to maintain a clear airway will improve Outcome: Progressing   Problem: Clinical Measurements: Goal: Respiratory complications will improve Outcome: Progressing   Problem: Education: Goal: Knowledge of risk factors and measures for prevention of condition will improve Outcome: Not Progressing   Problem: Coping: Goal: Psychosocial and spiritual needs will be supported Outcome: Not Progressing   Problem: Education: Goal: Knowledge of risk factors and measures for prevention of condition will improve Outcome: Not Progressing   Problem: Coping: Goal: Psychosocial and spiritual needs will be supported Outcome: Not Progressing   Problem: Education: Goal: Knowledge of General Education information will improve Description: Including pain rating scale, medication(s)/side effects and non-pharmacologic comfort measures Outcome: Not Progressing   Problem: Health Behavior/Discharge Planning: Goal: Ability to manage health-related needs will improve Outcome: Not Progressing

## 2020-09-23 NOTE — Progress Notes (Signed)
Physical Therapy Treatment Patient Details Name: Howard Sawyer MRN: [id number] DOB: [birth year] Today's Date: 09/23/2020    History of Present Illness Pt is a 79 y.o. male admitted to ED on 09/17/20 from [facility] for covid PNA. PMH includes HTN, HLD, prostate cancer.   PT Comments    Pt progressing well with mobility. Today's session focused on transfer training, standing activity bouts and pursed lip breathing. Pt received on 15L O2 HFNC + 15L NRB at 100% SpO2. SpO2 down to 77% on 12L O2 [name], returning to >/92% on 15L O2 [name] with seated rest and deep breathing. Pt motivated to regain PLOF. Pt continually asking "Can't they just give me a shot of antibiotics... my other doctor did... I don't want to die," despite attempts at education regarding COVID and current treatment plan, requiring frequent redirection to task/topic. Will continue to follow acutely to address established goals.  SpO2 maintaining >/92% on 12L O2 HFNC at end of session (NRB left off)    Follow Up Recommendations  No PT follow up;Supervision for mobility/OOB (return to detention center)     Equipment Recommendations  Rolling walker with 5" wheels    Recommendations for Other Services       Precautions / Restrictions Precautions Precautions: Fall Precaution Comments: watch sats Restrictions Weight Bearing Restrictions: No    Mobility  Bed Mobility                  Transfers Overall transfer level: Needs assistance Equipment used: Rolling walker (2 wheeled) Transfers: Sit to/from Stand Sit to Stand: Supervision         General transfer comment: Multiple sit<>stands from recliner to RW, cues for hand placement with good carry over on subsequent trials; stand from [facility] without RW, min guard for balance  Ambulation/Gait Ambulation/Gait assistance: Supervision Gait Distance (Feet): 94 Feet Assistive device: Rolling walker (2 wheeled) Gait Pattern/deviations: Step-through  pattern;Decreased stride length;Trunk flexed Gait velocity: Decreased   General Gait Details: Slow, labored gait with RW and supervision for safety/SpO2 monitoring/line management; step length and gait speed limited by shackles around ankles. Walked 30' + 30' + 34' with seated rest between gait trials to recover; SpO2 down to 83% on 15L O2 [name],   Stairs             Wheelchair Mobility    Modified Rankin (Stroke Patients Only)       Balance Overall balance assessment: Needs assistance Sitting-balance support: No upper extremity supported;Feet unsupported Sitting balance-[name] Scale: Good     Standing balance support: Bilateral upper extremity supported;During functional activity;No upper extremity supported Standing balance-[name] Scale: Fair Standing balance comment: able to stand and ambulate without AD, but does not accept challenge; reliant on UE support for posterior pericare                            Cognition Arousal/Alertness: Awake/alert Behavior During Therapy: Flat affect Overall Cognitive Status: No family/caregiver present to determine baseline cognitive functioning                                 General Comments: Pt pleasant with flat affect, able to follow all directions but seemed to have slow processing and problem solving. Continued to ask why doctor "can't just give me a shot" although PT tried to redirect from this convo multiple times and explain COVID vaccine, etc.  Exercises Other Exercises Other Exercises: Upright sitting edge of chair with pursed lip breathing trials    General Comments        Pertinent Vitals/Pain Pain Assessment: Faces Faces Pain Scale: Hurts a little bit Pain Location: Chest Pain Descriptors / Indicators: Tightness;Tiring Pain Intervention(s): Monitored during session    Home Living                      Prior Function            PT Goals (current goals can now be found in  the care plan section) Progress towards PT goals: Progressing toward goals    Frequency    Min 3X/week      PT Plan Current plan remains appropriate    Co-evaluation              AM-PAC PT "6 Clicks" Mobility   Outcome Measure  Help needed turning from your back to your side while in a flat bed without using bedrails?: None Help needed moving from lying on your back to sitting on the side of a flat bed without using bedrails?: None Help needed moving to and from a bed to a chair (including a wheelchair)?: None Help needed standing up from a chair using your arms (e.g., wheelchair or bedside chair)?: None Help needed to walk in hospital room?: None Help needed climbing 3-5 steps with a railing? : A Little 6 Click Score: 23    End of Session Equipment Utilized During Treatment: Oxygen;Gait belt Activity Tolerance: Patient tolerated treatment well Patient left: in chair;with call bell/phone within reach (guard outside door) Nurse Communication: Mobility status PT Visit Diagnosis: Other abnormalities of gait and mobility (R26.89);Difficulty in walking, not elsewhere classified (R26.2)     Time: 6701-4103 PT Time Calculation (min) (ACUTE ONLY): 28 min  Charges:  $Therapeutic Exercise: 8-22 mins $Therapeutic Activity: 8-22 mins                     [name], PT, DPT Acute Rehabilitation Services  Pager [phone] Office [name] 09/23/2020, 2:38 PM

## 2020-09-24 DIAGNOSIS — J1282 Pneumonia due to coronavirus disease 2019: Secondary | ICD-10-CM | POA: Diagnosis not present

## 2020-09-24 DIAGNOSIS — J9601 Acute respiratory failure with hypoxia: Secondary | ICD-10-CM | POA: Diagnosis not present

## 2020-09-24 DIAGNOSIS — U071 COVID-19: Secondary | ICD-10-CM | POA: Diagnosis not present

## 2020-09-24 LAB — COMPREHENSIVE METABOLIC PANEL
ALT: 69 U/L — ABNORMAL HIGH (ref 0–44)
AST: 32 U/L (ref 15–41)
Albumin: 3.1 g/dL — ABNORMAL LOW (ref 3.5–5.0)
Alkaline Phosphatase: 71 U/L (ref 38–126)
Anion gap: 10 (ref 5–15)
BUN: 20 mg/dL (ref 8–23)
CO2: 29 mmol/L (ref 22–32)
Calcium: 8.9 mg/dL (ref 8.9–10.3)
Chloride: 100 mmol/L (ref 98–111)
Creatinine, Ser: 0.99 mg/dL (ref 0.61–1.24)
GFR, Estimated: >60 mL/min (ref >60)
Glucose, Bld: 149 mg/dL — ABNORMAL HIGH (ref 70–99)
Potassium: 4.1 mmol/L (ref 3.5–5.1)
Sodium: 139 mmol/L (ref 135–145)
Total Bilirubin: 1.1 mg/dL (ref 0.3–1.2)
Total Protein: 6.4 g/dL — ABNORMAL LOW (ref 6.5–8.1)

## 2020-09-24 LAB — BRAIN NATRIURETIC PEPTIDE: B Natriuretic Peptide: 94.6 pg/mL (ref 0.0–100.0)

## 2020-09-24 LAB — CBC
HCT: 45.2 % (ref 39.0–52.0)
Hemoglobin: 15.1 g/dL (ref 13.0–17.0)
MCH: 30.4 pg (ref 26.0–34.0)
MCHC: 33.4 g/dL (ref 30.0–36.0)
MCV: 90.9 fL (ref 80.0–100.0)
Platelets: 352 10*3/uL (ref 150–400)
RBC: 4.97 MIL/uL (ref 4.22–5.81)
RDW: 13 % (ref 11.5–15.5)
WBC: 11.4 10*3/uL — ABNORMAL HIGH (ref 4.0–10.5)
nRBC: 0 % (ref 0.0–0.2)

## 2020-09-24 LAB — GLUCOSE, CAPILLARY
Glucose-Capillary: 148 mg/dL — ABNORMAL HIGH (ref 70–99)
Glucose-Capillary: 297 mg/dL — ABNORMAL HIGH (ref 70–99)
Glucose-Capillary: 194 mg/dL — ABNORMAL HIGH (ref 70–99)
Glucose-Capillary: 196 mg/dL — ABNORMAL HIGH (ref 70–99)

## 2020-09-24 LAB — C-REACTIVE PROTEIN: CRP: 1.4 mg/dL — ABNORMAL HIGH (ref <1.0)

## 2020-09-24 LAB — PROCALCITONIN: Procalcitonin: <0.1 ng/mL

## 2020-09-24 LAB — D-DIMER, QUANTITATIVE: D-Dimer, Quant: 0.46 ug/mL-FEU (ref 0.00–0.50)

## 2020-09-24 MED ORDER — INSULIN DETEMIR 100 UNIT/ML ~~LOC~~ SOLN
16.0000 [IU] | Freq: Every day | SUBCUTANEOUS | Status: DC
  Administered 2020-09-24 – 2020-09-27 (×4): 16 [IU] via SUBCUTANEOUS
  Filled 2020-09-24 (×4): qty 0.16

## 2020-09-24 MED ORDER — INSULIN ASPART 100 UNIT/ML ~~LOC~~ SOLN
4.0000 [IU] | Freq: Three times a day (TID) | SUBCUTANEOUS | Status: DC
  Administered 2020-09-24 – 2020-09-28 (×11): 4 [IU] via SUBCUTANEOUS

## 2020-09-24 MED ORDER — LINAGLIPTIN 5 MG PO TABS
5.0000 mg | ORAL_TABLET | Freq: Every day | ORAL | Status: DC
  Administered 2020-09-24 – 2020-09-28 (×5): 5 mg via ORAL
  Filled 2020-09-24 (×5): qty 1

## 2020-09-24 MED ORDER — IPRATROPIUM-ALBUTEROL 20-100 MCG/ACT IN AERS
1.0000 | INHALATION_SPRAY | Freq: Four times a day (QID) | RESPIRATORY_TRACT | Status: DC
  Administered 2020-09-24 – 2020-09-26 (×10): 1 via RESPIRATORY_TRACT
  Filled 2020-09-24: qty 4

## 2020-09-24 NOTE — Progress Notes (Signed)
PROGRESS NOTE    Howard Sawyer  DVV:[id number] DOB: May 24, 1941 DOA: 09/17/2020 PCP: Asencion Noble, MD    Brief Narrative:   Howard Sawyer is a 79 year-old male with past medical history notable for essential hypertension, hyperlipidemia, diverticulosis, prostate cancer who presented to the emergency department with progressive shortness of breath, weakness, poor appetite, cough fever.  Patient was sent in from the [facility] due to positive Covid test and hypoxia.  On EMS arrival, his oxygen saturation was noted to be in the mid 80s.  In the ED, temperature 98.5, HR 56, RR 16, BP 104/86, SPO2 low 80s on room air improved to mid 90s on 4 L nasal cannula.  Sodium 128, glucose 153, AST 228, ALT 24.  CRP 2.9, procalcitonin 0.14, D-dimer 0.3.  Chest x-ray with bilateral airspace infiltrates consistent with multifocal pneumonia.  Remdesivir and Decadron were initiated in the ED.  Hospital service was consulted for further evaluation and treatment of acute hypoxic respiratory failure secondary to Covid-19 viral pneumonia.  Subjective:  Change reports dyspnea, and cough, but otherwise no significant events as discussed with staff. Assessment & Plan:   Principal Problem:   Pneumonia due to COVID-19 virus Active Problems:   Essential hypertension   Hyponatremia   Transaminitis   Acute hypoxic respiratory failure secondary to acute Covid-19 viral pneumonia during the ongoing 2020/2021 Covid 19 Pandemic  -Unfortunately patient is unvaccinated. -He does appear to be having severe COVID-19 pneumonia (present on admission. -Continue with IV Solu-Medrol, dose has increased to 80 mg every 12 hours.. -He is treated with IV remdesivir. -patient is poor historian, he is unaware of any TB or hepatitis history, I have discussed with prison nurse station, there is no history of TB or hepatitis viral infection the record, as well patient denies any of these diagnoses on [facility] jail in  2019.  Negative hepatitis panel on admission, gold QuantiFERON is pending, continue with baricitinib -Was encouraged use incentive spirometry and flutter valve, out of bed to chair, and to plan once possible. -Continue to trend inflammatory markers daily. -Patient remains with significant oxygen requirement, he is currently on 15 L high flow nasal cannula and 15 L NRB.     SpO2: 100 % O2 Flow Rate (L/min): 15 L/min  The treatment plan and use of medications and known side effects were discussed with patient/family. Some of the medications used are based on case reports/anecdotal data.  All other medications being used in the management of COVID-19 based on limited study data.  Complete risks and long-term side effects are unknown, however in the best clinical judgment they seem to be of some benefit.  Patient wanted to proceed with treatment options provided.  Hyponatremia - resolved  Hyperglycemia Hemoglobin A1c 5.9. Likely induced by IV steriods. --Levemir 8u Dilkon daily --ISS --CBGs qAC/HS  Moderate Protein calorie malnutrition Body mass index is 24.33 kg/m. --nutrition following --Continue supplementation with Ensure 3 times daily, multivitamin  Transaminitis - Etiology likely secondary to Covid-19 viral infection.  Acute hepatitis panel negative. -Trending down  Essential hypertension --Losartan 100 mg p.o. daily --Aspirin 81 mg p.o. daily  Weakness/debility/deconditioning --PT/OT following  DVT prophylaxis: Lovenox Code Status: Full code Family Communication: Updated patient extensively at bedside, as well I have discussed with nurse station at [facility] [phone], on 10/20, as well unable to update any family members per  Correction facility policy.  Disposition Plan:  Status is: Inpatient  Remains inpatient appropriate because:Hemodynamically unstable, Unsafe d/c plan  and IV treatments appropriate due to intensity of illness or inability to take  PO   Dispo: The patient is from: [facility]              Anticipated d/c is to: [facility]              Anticipated d/c date is: 3 days              Patient currently is not medically stable to d/c.   Consultants:   None  Procedures:   None  Antimicrobials:   None     Objective: Vitals:   09/24/20 0400 09/24/20 0705 09/24/20 0737 09/24/20 1132  BP: 117/62  133/64 110/64  Pulse: 65 67 74 88  Resp: 19 19 20 19   Temp: 98 F (36.7 C)  98.2 F (36.8 C) 98.7 F (37.1 C)  TempSrc: Axillary  Oral Axillary  SpO2: (!) 86% 97% 93% 100%  Weight:      Height:        Intake/Output Summary (Last 24 hours) at 09/24/2020 1403 Last data filed at 09/24/2020 0900 Gross per 24 hour  Intake 240 ml  Output 600 ml  Net -360 ml   Filed Weights   09/17/20 0015  Weight: 72.6 kg    Examination:  Awake Alert, patient has improved, he is oriented x3 today, more appropriate care, he is extremely frail and deconditioned . Symmetrical Chest wall movement, Good air movement bilaterally, scattered Rales bilaterally RRR,No Gallops,Rubs or new Murmurs, No Parasternal Heave +ve B.Sounds, Abd Soft, No tenderness, No rebound - guarding or rigidity. No Cyanosis, Clubbing or edema, No new Rash or bruise    Data Reviewed: I have personally reviewed following labs and imaging studies  CBC: Recent Labs  Lab 09/18/20 0316 09/18/20 0316 09/19/20 0324 09/19/20 0324 09/20/20 0250 09/21/20 0409 09/22/20 0054 09/23/20 0124 09/24/20 0721  WBC 5.2   < > 7.6   < > 8.9 9.7 13.8* 16.5* 11.4*  NEUTROABS 3.8  --  6.3  --  7.3 8.6* 12.0*  --   --   HGB 15.1   < > 14.2   < > 14.0 13.3 14.7 15.4 15.1  HCT 43.4   < > 41.8   < > 42.0 39.4 42.6 44.4 45.2  MCV 88.9   < > 89.1   < > 89.9 89.7 89.9 90.1 90.9  PLT 211   < > 268   < > 299 310 304 404* 352   < > = values in this interval not displayed.   Basic Metabolic Panel: Recent Labs  Lab 09/18/20 0316 09/18/20 0316  09/19/20 0324 09/19/20 0324 09/20/20 0250 09/21/20 0409 09/22/20 0054 09/23/20 0124 09/24/20 0721  NA 135   < > 135   < > 137 137 137 138 139  K 3.9   < > 4.1   < > 4.4 4.7 4.2 4.2 4.1  CL 101   < > 100   < > 100 103 101 101 100  CO2 24   < > 26   < > 29 26 25 25 29   GLUCOSE 155*   < > 181*   < > 194* 242* 193* 178* 149*  BUN 14   < > 17   < > 24* 27* 24* 26* 20  CREATININE 0.88   < > 0.97   < > 1.01 0.87 0.97 1.00 0.99  CALCIUM 8.2*   < > 8.2*   < > 8.4* 8.2*  8.3* 8.9 8.9  MG 2.2  --  2.2  --  2.3 2.2 2.1  --   --    < > = values in this interval not displayed.   GFR: Estimated Creatinine Clearance: 60.5 mL/min (by C-G formula based on SCr of 0.99 mg/dL). Liver Function Tests: Recent Labs  Lab 09/20/20 0250 09/21/20 0409 09/22/20 0054 09/23/20 0124 09/24/20 0721  AST 82* 53* 31 26 32  ALT 136* 107* 96* 79* 69*  ALKPHOS 63 66 55 62 71  BILITOT 0.6 0.5 0.7 0.7 1.1  PROT 5.2* 4.8* 5.1* 5.7* 6.4*  ALBUMIN 2.7* 2.4* 2.7* 2.9* 3.1*   No results for input(s): LIPASE, AMYLASE in the last 168 hours. No results for input(s): AMMONIA in the last 168 hours. Coagulation Profile: No results for input(s): INR, PROTIME in the last 168 hours. Cardiac Enzymes: No results for input(s): CKTOTAL, CKMB, CKMBINDEX, TROPONINI in the last 168 hours. BNP (last 3 results) No results for input(s): PROBNP in the last 8760 hours. HbA1C: No results for input(s): HGBA1C in the last 72 hours. CBG: Recent Labs  Lab 09/23/20 1215 09/23/20 1544 09/23/20 2118 09/24/20 0734 09/24/20 1135  GLUCAP 316* 298* 236* 148* 297*   Lipid Profile: No results for input(s): CHOL, HDL, LDLCALC, TRIG, CHOLHDL, LDLDIRECT in the last 72 hours. Thyroid Function Tests: No results for input(s): TSH, T4TOTAL, FREET4, T3FREE, THYROIDAB in the last 72 hours. Anemia Panel: No results for input(s): VITAMINB12, FOLATE, FERRITIN, TIBC, IRON, RETICCTPCT in the last 72 hours. Sepsis Labs: Recent Labs  Lab  09/24/20 0721  PROCALCITON <0.10    Recent Results (from the past 240 hour(s))  Respiratory Panel by RT PCR (Flu A&B, Covid) - Nasopharyngeal Swab     Status: Abnormal   Collection Time: 09/17/20 12:19 AM   Specimen: Nasopharyngeal Swab  Result Value Ref Range Status   SARS Coronavirus 2 by RT PCR POSITIVE (A) NEGATIVE Final    Comment: RESULT CALLED TO, READ BACK BY AND VERIFIED WITH: T EASTER,RN@0253  09/17/20 MKELLY (NOTE) SARS-CoV-2 target nucleic acids are DETECTED.  SARS-CoV-2 RNA is generally detectable in upper respiratory specimens  during the acute phase of infection. Positive results are indicative of the presence of the identified virus, but do not rule out bacterial infection or co-infection with other pathogens not detected by the test. Clinical correlation with patient history and other diagnostic information is necessary to determine patient infection status. The expected result is Negative.  Fact Sheet for Patients:  PinkCheek.be  Fact Sheet for Healthcare Providers: GravelBags.it  This test is not yet approved or cleared by the [place] FDA and  has been authorized for detection and/or diagnosis of SARS-CoV-2 by FDA under an Emergency Use Authorization (EUA).  This EUA will remain in effect (meaning this test can be [place] ed) for the duration of  the COVID-19 declaration under Section 564(b)(1) of the Act, 21 U.S.C. section 360bbb-3(b)(1), unless the authorization is terminated or revoked sooner.      Influenza A by PCR NEGATIVE NEGATIVE Final   Influenza B by PCR NEGATIVE NEGATIVE Final    Comment: (NOTE) The Xpert Xpress SARS-CoV-2/FLU/RSV assay is intended as an aid in  the diagnosis of influenza from Nasopharyngeal swab specimens and  should not be used as a sole basis for treatment. Nasal washings and  aspirates are unacceptable for Xpert Xpress SARS-CoV-2/FLU/RSV  testing.  Fact Sheet  for Patients: PinkCheek.be  Fact Sheet for Healthcare Providers: GravelBags.it  This test is not yet approved or  cleared by the [place] and  has been authorized for detection and/or diagnosis of SARS-CoV-2 by  FDA under an Emergency Use Authorization (EUA). This EUA will remain  in effect (meaning this test can be used) for the duration of the  Covid-19 declaration under Section 564(b)(1) of the Act, 21  U.S.C. section 360bbb-3(b)(1), unless the authorization is  terminated or revoked. Performed at [facility], [address] [place], [place] [address]   Blood Culture (routine x 2)     Status: None   Collection Time: 09/17/20  1:18 AM   Specimen: BLOOD  Result Value Ref Range Status   Specimen Description BLOOD  Final   Special Requests NONE  Final   Culture   Final    NO GROWTH 5 DAYS Performed at [facility], [address], [place], [place] [address]    Report Status 09/22/2020 FINAL  Final  Blood Culture (routine x 2)     Status: None   Collection Time: 09/17/20  1:21 AM   Specimen: BLOOD  Result Value Ref Range Status   Specimen Description BLOOD RIGHT ANTECUBITAL  Final   Special Requests   Final    BOTTLES DRAWN AEROBIC AND ANAEROBIC Blood Culture adequate volume   Culture   Final    NO GROWTH 5 DAYS Performed at [facility], [address] [place], [place] [address]    Report Status 09/22/2020 FINAL  Final         Radiology Studies: [facility] Chest Port 1 View  Result Date: 09/23/2020 CLINICAL DATA:  COVID-19. EXAM: PORTABLE CHEST 1 VIEW COMPARISON:  09/17/2020 FINDINGS: Similar versus slightly improved peripheral predominant interstitial opacities. No new focal consolidation. No visible pleural effusions or pneumothorax. Cardiomediastinal silhouette is similar to prior. No acute osseous abnormality. IMPRESSION: Similar versus slightly improved peripheral predominant interstitial opacities,  compatible with reported COVID diagnosis. Electronically Signed   By: Margaretha Sheffield MD   On: 09/23/2020 15:31        Scheduled Meds: . vitamin C  500 mg Oral Daily  . aspirin EC  81 mg Oral Daily  . baricitinib  4 mg Oral Daily  . enoxaparin (LOVENOX) injection  40 mg Subcutaneous Q24H  . feeding supplement  237 mL Oral TID BM  . insulin aspart  0-5 Units Subcutaneous QHS  . insulin aspart  0-9 Units Subcutaneous TID WC  . insulin aspart  4 Units Subcutaneous TID WC  . insulin detemir  16 Units Subcutaneous Daily  . linagliptin  5 mg Oral Daily  . losartan  100 mg Oral Daily  . methylPREDNISolone (SOLU-MEDROL) injection  80 mg Intravenous Q12H  . multivitamin with minerals  1 tablet Oral Daily  . zinc sulfate  220 mg Oral Daily   Continuous Infusions:    LOS: 7 days      Phillips Climes, MD [facility] Please refer to coverage provider listed on [email or web address] 09/24/2020, 2:03 PM

## 2020-09-24 NOTE — TOC Progression Note (Signed)
Transition of Care [facility]) - Progression Note    Patient Details  Name: Howard Sawyer MRN: [id number] Date of Birth: 1941-09-11  Transition of Care [facility]) CM/SW [place], RN Phone Number: 09/24/2020, 4:17 PM  Clinical Narrative:    Severe COVID Pneumonia- Updated the Contact information so [name] is first for now. If patient is released, it can be changed to patients wife. Any information should go through the [place] system . Patient currently on 15L, but 100%saturations hopefully triturating down as  Can be to maintain saturation >88 Any recommendations of services, Home Health equipment, oxygen will have to be through [facility]..    Expected Discharge Plan: [facility] Barriers to Discharge: Continued Medical Work up  Expected Discharge Plan and Services Expected Discharge Plan: [place] Determinants of Health (SDOH) Interventions    Readmission Risk Interventions No flowsheet data found.

## 2020-09-24 NOTE — Progress Notes (Signed)
Follow Up Nutrition Note  DOCUMENTATION CODES:   Not applicable  INTERVENTION:  Continue Ensure Enlive po TID, each supplement provides 350 kcal and 20 grams of protein  Encourage adequate PO intake.   NUTRITION DIAGNOSIS:   Increased nutrient needs related to catabolic illness (JIRCV-89 PNA) as evidenced by estimated needs; ongoing  GOAL:   Patient will meet greater than or equal to 90% of their needs; met  MONITOR:   PO intake, Supplement acceptance  REASON FOR ASSESSMENT:   Consult Assessment of nutrition requirement/status  ASSESSMENT:   79 yo male [facility] from jail with progressive SOB, weakness, poor appetite, and cough r/t COVID PNA. PMH includes HTN, HLD, diverticulosis, prostate cancer.  Pt is currently on 15 L HFNC. Meal completion has been 80-95%. Pt currently has Ensure ordered and has been consuming them. RD to continue with current orders to aid in increased caloric and protein needs. Pt encouraged to eat his food at meals and to drink his supplements.   Labs and medications reviewed.   Diet Order:   Diet Order            Diet heart healthy/carb modified Room service appropriate? Yes; Fluid consistency: Thin  Diet effective now                 EDUCATION NEEDS:   No education needs have been identified at this time  Skin:  Skin Assessment: Reviewed RN Assessment  Last BM:  10/20  Height:   Ht Readings from Last 1 Encounters:  09/17/20 5' 8"  (1.727 m)    Weight:   Wt Readings from Last 1 Encounters:  09/17/20 72.6 kg    Ideal Body Weight:  70 kg  BMI:  Body mass index is 24.33 kg/m.  Estimated Nutritional Needs:   Kcal:  2000-2200  Protein:  100-120 gm  Fluid:  >/= 2 L  Corrin Parker, MS, RD, LDN RD pager number/after hours weekend pager number on Amion.

## 2020-09-25 DIAGNOSIS — J1282 Pneumonia due to coronavirus disease 2019: Secondary | ICD-10-CM | POA: Diagnosis not present

## 2020-09-25 DIAGNOSIS — R7401 Elevation of levels of liver transaminase levels: Secondary | ICD-10-CM | POA: Diagnosis not present

## 2020-09-25 DIAGNOSIS — U071 COVID-19: Secondary | ICD-10-CM | POA: Diagnosis not present

## 2020-09-25 LAB — COMPREHENSIVE METABOLIC PANEL
ALT: 40 U/L (ref 0–44)
AST: 22 U/L (ref 15–41)
Albumin: 2.2 g/dL — ABNORMAL LOW (ref 3.5–5.0)
Alkaline Phosphatase: 44 U/L (ref 38–126)
Anion gap: 7 (ref 5–15)
BUN: 27 mg/dL — ABNORMAL HIGH (ref 8–23)
CO2: 28 mmol/L (ref 22–32)
Calcium: 8.2 mg/dL — ABNORMAL LOW (ref 8.9–10.3)
Chloride: 101 mmol/L (ref 98–111)
Creatinine, Ser: 0.99 mg/dL (ref 0.61–1.24)
GFR, Estimated: >60 mL/min (ref >60)
Glucose, Bld: 189 mg/dL — ABNORMAL HIGH (ref 70–99)
Potassium: 4.5 mmol/L (ref 3.5–5.1)
Sodium: 136 mmol/L (ref 135–145)
Total Bilirubin: 0.8 mg/dL (ref 0.3–1.2)
Total Protein: 4.7 g/dL — ABNORMAL LOW (ref 6.5–8.1)

## 2020-09-25 LAB — PROCALCITONIN: Procalcitonin: <0.1 ng/mL

## 2020-09-25 LAB — GLUCOSE, CAPILLARY
Glucose-Capillary: 121 mg/dL — ABNORMAL HIGH (ref 70–99)
Glucose-Capillary: 163 mg/dL — ABNORMAL HIGH (ref 70–99)
Glucose-Capillary: 133 mg/dL — ABNORMAL HIGH (ref 70–99)
Glucose-Capillary: 83 mg/dL (ref 70–99)

## 2020-09-25 LAB — CBC
HCT: 39.2 % (ref 39.0–52.0)
Hemoglobin: 12.8 g/dL — ABNORMAL LOW (ref 13.0–17.0)
MCH: 30.3 pg (ref 26.0–34.0)
MCHC: 32.7 g/dL (ref 30.0–36.0)
MCV: 92.9 fL (ref 80.0–100.0)
Platelets: 301 10*3/uL (ref 150–400)
RBC: 4.22 MIL/uL (ref 4.22–5.81)
RDW: 13.2 % (ref 11.5–15.5)
WBC: 10.5 10*3/uL (ref 4.0–10.5)
nRBC: 0 % (ref 0.0–0.2)

## 2020-09-25 LAB — C-REACTIVE PROTEIN: CRP: 0.7 mg/dL (ref <1.0)

## 2020-09-25 LAB — D-DIMER, QUANTITATIVE: D-Dimer, Quant: 0.66 ug/mL-FEU — ABNORMAL HIGH (ref 0.00–0.50)

## 2020-09-25 NOTE — Progress Notes (Signed)
Physical Therapy Treatment Patient Details Name: Howard Sawyer MRN: [id number] DOB: 1941-09-08 Today's Date: 09/25/2020    History of Present Illness Pt is a 79 y.o. male admitted to ED on 09/17/20 from [facility] for covid PNA. PMH includes HTN, HLD, prostate cancer.    PT Comments    Pt received in bed, reports fatigue but agreeable to participation in therapy. He required min guard assist transfer. Pt performed LE exercises in supine. SpO2 91% on 8L. Pt requesting return to bed at end of session due to fatigue. He ambulated with OT prior to PT session.    Follow Up Recommendations  No PT follow up;Supervision for mobility/OOB     Equipment Recommendations  Rolling walker with 5" wheels    Recommendations for Other Services       Precautions / Restrictions Precautions Precautions: Fall;Other (comment) Precaution Comments: watch sats    Mobility  Bed Mobility Overal bed mobility: Independent Bed Mobility: Supine to Sit;Sit to Supine     Supine to sit: Independent Sit to supine: Independent      Transfers Overall transfer level: Needs assistance Equipment used: None Transfers: Sit to/from [facility] Sit to Stand: Min guard Stand pivot transfers: Min guard          Ambulation/Gait                 Stairs             Wheelchair Mobility    Modified Rankin (Stroke Patients Only)       Balance                                            Cognition Arousal/Alertness: Awake/alert Behavior During Therapy: WFL for tasks assessed/performed Overall Cognitive Status: Within Functional Limits for tasks assessed                                        Exercises General Exercises - Lower Extremity Ankle Circles/Pumps: AROM;Both;10 reps;Supine Quad Sets: AROM;Both;10 reps;Supine Heel Slides: AROM;Right;Left;10 reps;Supine    General Comments        Pertinent Vitals/Pain  Pain Assessment: Faces Faces Pain Scale: Hurts little more Pain Location: back Pain Descriptors / Indicators: Discomfort Pain Intervention(s): Repositioned;Monitored during session;Limited activity within patient's tolerance    Home Living                      Prior Function            PT Goals (current goals can now be found in the care plan section) Acute Rehab PT Goals Patient Stated Goal: feel stronger Progress towards PT goals: Progressing toward goals    Frequency    Min 3X/week      PT Plan Current plan remains appropriate    Co-evaluation              AM-PAC PT "6 Clicks" Mobility   Outcome Measure  Help needed turning from your back to your side while in a flat bed without using bedrails?: None Help needed moving from lying on your back to sitting on the side of a flat bed without using bedrails?: None Help needed moving to and from a bed to a chair (including a wheelchair)?: A Little Help needed  standing up from a chair using your arms (e.g., wheelchair or bedside chair)?: A Little Help needed to walk in hospital room?: A Little Help needed climbing 3-5 steps with a railing? : A Little 6 Click Score: 20    End of Session Equipment Utilized During Treatment: Oxygen;Gait belt Activity Tolerance: Patient limited by fatigue Patient left: in bed;with call bell/phone within reach Nurse Communication: Mobility status PT Visit Diagnosis: Other abnormalities of gait and mobility (R26.89);Difficulty in walking, not elsewhere classified (R26.2)     Time: 1040-1051 PT Time Calculation (min) (ACUTE ONLY): 11 min  Charges:  $Therapeutic Exercise: 8-22 mins                     Lorrin Goodell, PT  Office # [phone] Pager [phone]    Lorriane Shire 09/25/2020, 1:04 PM

## 2020-09-25 NOTE — Progress Notes (Signed)
PROGRESS NOTE    Howard Sawyer  PFX:[id number] DOB: [birth year] DOA: 09/17/2020 PCP: Asencion Noble, MD    Brief Narrative:   Howard Sawyer is a 79 year-old male with past medical history notable for essential hypertension, hyperlipidemia, diverticulosis, prostate cancer who presented to the emergency department with progressive shortness of breath, weakness, poor appetite, cough fever.  Patient was sent in from the [facility] due to positive Covid test and hypoxia.  On EMS arrival, his oxygen saturation was noted to be in the mid 80s.  In the ED, temperature 98.5, HR 56, RR 16, BP 104/86, SPO2 low 80s on room air improved to mid 90s on 4 L nasal cannula.  Sodium 128, glucose 153, AST 228, ALT 24.  CRP 2.9, procalcitonin 0.14, D-dimer 0.3.  Chest x-ray with bilateral airspace infiltrates consistent with multifocal pneumonia.  Remdesivir and Decadron were initiated in the ED.  Hospital service was consulted for further evaluation and treatment of acute hypoxic respiratory failure secondary to Covid-19 viral pneumonia.  Subjective:  Patient reports he is feeling better today, dyspnea and cough has improved.  Assessment & Plan:   Principal Problem:   Pneumonia due to COVID-19 virus Active Problems:   Essential hypertension   Hyponatremia   Transaminitis   Acute hypoxic respiratory failure secondary to acute Covid-19 viral pneumonia during the ongoing 2020/2021 Covid 19 Pandemic  -Unfortunately patient is unvaccinated. -He does appear to be having severe COVID-19 pneumonia (present on admission. -Continue with IV Solu-Medrol, dose has increased to 80 mg every 12 hours.. -He is treated with IV remdesivir. -Continue with baricitinib -Continue to trend inflammatory markers daily. -With improved oxygen requirement this morning, he is on 4 L high flow nasal cannula.   -Encouraged with incentive spirometer and flutter valve.    SpO2: 98 % O2 Flow Rate (L/min): 4 L/min  The  treatment plan and use of medications and known side effects were discussed with patient/family. Some of the medications used are based on case reports/anecdotal data.  All other medications being used in the management of COVID-19 based on limited study data.  Complete risks and long-term side effects are unknown, however in the best clinical judgment they seem to be of some benefit.  Patient wanted to proceed with treatment options provided.  Hyponatremia - resolved  Hyperglycemia Hemoglobin A1c 5.9. Likely induced by IV steriods. --Levemir 8u Lund daily --ISS --CBGs qAC/HS  Moderate Protein calorie malnutrition Body mass index is 24.33 kg/m. --nutrition following --Continue supplementation with Ensure 3 times daily, multivitamin  Transaminitis - Etiology likely secondary to Covid-19 viral infection.  Acute hepatitis panel negative. -Trending down  Essential hypertension --Losartan 100 mg p.o. daily --Aspirin 81 mg p.o. daily  Weakness/debility/deconditioning --PT/OT following  DVT prophylaxis: Lovenox Code Status: Full code Family Communication: Updated patient extensively at bedside, as well I have discussed with nurse station at [facility] [phone], on 10/20, as well unable to update any family members per  [facility] facility policy. -I have discussed with Dr. Perley Jain at [place] at [facility] at [phone] on 10/21 Disposition Plan:  Status is: Inpatient  Remains inpatient appropriate because:Hemodynamically unstable, Unsafe d/c plan and IV treatments appropriate due to intensity of illness or inability to take PO   Dispo: The patient is from: [facility]              Anticipated d/c is to: [facility]              Anticipated  d/c date is: 3 days              Patient currently is not medically stable to d/c.   Consultants:   None  Procedures:   None  Antimicrobials:   None     Objective: Vitals:   09/25/20  0016 09/25/20 0400 09/25/20 0716 09/25/20 1140  BP: 120/69 (!) 117/59 123/60 (!) 117/52  Pulse: 63 60 63 69  Resp: 17 20 20 20   Temp: 97.8 F (36.6 C) 97.8 F (36.6 C) 98.6 F (37 C) 99 F (37.2 C)  TempSrc: Oral Oral Oral Oral  SpO2: 98% 99% 97% 98%  Weight:      Height:        Intake/Output Summary (Last 24 hours) at 09/25/2020 1501 Last data filed at 09/25/2020 5277 Gross per 24 hour  Intake 600 ml  Output 650 ml  Net -50 ml   Filed Weights   09/17/20 0015  Weight: 72.6 kg    Examination:  Awake Alert, Oriented X 3, No new F.N deficits, Normal affect, frail, Symmetrical Chest wall movement, Good air movement bilaterally, CTAB RRR,No Gallops,Rubs or new Murmurs, No Parasternal Heave +ve B.Sounds, Abd Soft, No tenderness, No rebound - guarding or rigidity. No Cyanosis, Clubbing or edema, No new Rash or bruise    Data Reviewed: I have personally reviewed following labs and imaging studies  CBC: Recent Labs  Lab 09/19/20 0324 09/19/20 0324 09/20/20 0250 09/20/20 0250 09/21/20 0409 09/22/20 0054 09/23/20 0124 09/24/20 0721 09/25/20 0232  WBC 7.6   < > 8.9   < > 9.7 13.8* 16.5* 11.4* 10.5  NEUTROABS 6.3  --  7.3  --  8.6* 12.0*  --   --   --   HGB 14.2   < > 14.0   < > 13.3 14.7 15.4 15.1 12.8*  HCT 41.8   < > 42.0   < > 39.4 42.6 44.4 45.2 39.2  MCV 89.1   < > 89.9   < > 89.7 89.9 90.1 90.9 92.9  PLT 268   < > 299   < > 310 304 404* 352 301   < > = values in this interval not displayed.   Basic Metabolic Panel: Recent Labs  Lab 09/19/20 0324 09/19/20 0324 09/20/20 0250 09/20/20 0250 09/21/20 0409 09/22/20 0054 09/23/20 0124 09/24/20 0721 09/25/20 0232  NA 135   < > 137   < > 137 137 138 139 136  K 4.1   < > 4.4   < > 4.7 4.2 4.2 4.1 4.5  CL 100   < > 100   < > 103 101 101 100 101  CO2 26   < > 29   < > 26 25 25 29 28   GLUCOSE 181*   < > 194*   < > 242* 193* 178* 149* 189*  BUN 17   < > 24*   < > 27* 24* 26* 20 27*  CREATININE 0.97   < > 1.01    < > 0.87 0.97 1.00 0.99 0.99  CALCIUM 8.2*   < > 8.4*   < > 8.2* 8.3* 8.9 8.9 8.2*  MG 2.2  --  2.3  --  2.2 2.1  --   --   --    < > = values in this interval not displayed.   GFR: Estimated Creatinine Clearance: 60.5 mL/min (by C-G formula based on SCr of 0.99 mg/dL). Liver Function Tests: Recent Labs  Lab 09/21/20 0409 09/22/20  1017 09/23/20 0124 09/24/20 0721 09/25/20 0232  AST 53* 31 26 32 22  ALT 107* 96* 79* 69* 40  ALKPHOS 66 55 62 71 44  BILITOT 0.5 0.7 0.7 1.1 0.8  PROT 4.8* 5.1* 5.7* 6.4* 4.7*  ALBUMIN 2.4* 2.7* 2.9* 3.1* 2.2*   No results for input(s): LIPASE, AMYLASE in the last 168 hours. No results for input(s): AMMONIA in the last 168 hours. Coagulation Profile: No results for input(s): INR, PROTIME in the last 168 hours. Cardiac Enzymes: No results for input(s): CKTOTAL, CKMB, CKMBINDEX, TROPONINI in the last 168 hours. BNP (last 3 results) No results for input(s): PROBNP in the last 8760 hours. HbA1C: No results for input(s): HGBA1C in the last 72 hours. CBG: Recent Labs  Lab 09/24/20 1135 09/24/20 1628 09/24/20 2114 09/25/20 0715 09/25/20 1139  GLUCAP 297* 194* 196* 121* 163*   Lipid Profile: No results for input(s): CHOL, HDL, LDLCALC, TRIG, CHOLHDL, LDLDIRECT in the last 72 hours. Thyroid Function Tests: No results for input(s): TSH, T4TOTAL, FREET4, T3FREE, THYROIDAB in the last 72 hours. Anemia Panel: No results for input(s): VITAMINB12, FOLATE, FERRITIN, TIBC, IRON, RETICCTPCT in the last 72 hours. Sepsis Labs: Recent Labs  Lab 09/24/20 0721 09/25/20 0232  PROCALCITON <0.10 <0.10    Recent Results (from the past 240 hour(s))  Respiratory Panel by RT PCR (Flu A&B, Covid) - Nasopharyngeal Swab     Status: Abnormal   Collection Time: 09/17/20 12:19 AM   Specimen: Nasopharyngeal Swab  Result Value Ref Range Status   SARS Coronavirus 2 by RT PCR POSITIVE (A) NEGATIVE Final    Comment: RESULT CALLED TO, READ BACK BY AND VERIFIED WITH: T  EASTER,RN@0253  09/17/20 MKELLY (NOTE) SARS-CoV-2 target nucleic acids are DETECTED.  SARS-CoV-2 RNA is generally detectable in upper respiratory specimens  during the acute phase of infection. Positive results are indicative of the presence of the identified virus, but do not rule out bacterial infection or co-infection with other pathogens not detected by the test. Clinical correlation with patient history and other diagnostic information is necessary to determine patient infection status. The expected result is Negative.  Fact Sheet for Patients:  PinkCheek.be  Fact Sheet for Healthcare Providers: GravelBags.it  This test is not yet approved or cleared by the [place] FDA and  has been authorized for detection and/or diagnosis of SARS-CoV-2 by FDA under an Emergency Use Authorization (EUA).  This EUA will remain in effect (meaning this test can be [place] ed) for the duration of  the COVID-19 declaration under Section 564(b)(1) of the Act, 21 U.S.C. section 360bbb-3(b)(1), unless the authorization is terminated or revoked sooner.      Influenza A by PCR NEGATIVE NEGATIVE Final   Influenza B by PCR NEGATIVE NEGATIVE Final    Comment: (NOTE) The Xpert Xpress SARS-CoV-2/FLU/RSV assay is intended as an aid in  the diagnosis of influenza from Nasopharyngeal swab specimens and  should not be used as a sole basis for treatment. Nasal washings and  aspirates are unacceptable for Xpert Xpress SARS-CoV-2/FLU/RSV  testing.  Fact Sheet for Patients: PinkCheek.be  Fact Sheet for Healthcare Providers: GravelBags.it  This test is not yet approved or cleared by the [place] FDA and  has been authorized for detection and/or diagnosis of SARS-CoV-2 by  FDA under an Emergency Use Authorization (EUA). This EUA will remain  in effect (meaning this test can be used) for the  duration of the  Covid-19 declaration under Section 564(b)(1) of the Act, 21  U.S.C. section  360bbb-3(b)(1), unless the authorization is  terminated or revoked. Performed at [facility], [address], [place], [place] [address]   Blood Culture (routine x 2)     Status: None   Collection Time: 09/17/20  1:18 AM   Specimen: BLOOD  Result Value Ref Range Status   Specimen Description BLOOD  Final   Special Requests NONE  Final   Culture   Final    NO GROWTH 5 DAYS Performed at [facility], [address], [place], [place] [address]    Report Status 09/22/2020 FINAL  Final  Blood Culture (routine x 2)     Status: None   Collection Time: 09/17/20  1:21 AM   Specimen: BLOOD  Result Value Ref Range Status   Specimen Description BLOOD RIGHT ANTECUBITAL  Final   Special Requests   Final    BOTTLES DRAWN AEROBIC AND ANAEROBIC Blood Culture adequate volume   Culture   Final    NO GROWTH 5 DAYS Performed at [facility], [address], [place],  [address]    Report Status 09/22/2020 FINAL  Final         Radiology Studies: No results found.      Scheduled Meds: . vitamin C  500 mg Oral Daily  . aspirin EC  81 mg Oral Daily  . baricitinib  4 mg Oral Daily  . enoxaparin (LOVENOX) injection  40 mg Subcutaneous Q24H  . feeding supplement  237 mL Oral TID BM  . insulin aspart  0-5 Units Subcutaneous QHS  . insulin aspart  0-9 Units Subcutaneous TID WC  . insulin aspart  4 Units Subcutaneous TID WC  . insulin detemir  16 Units Subcutaneous Daily  . Ipratropium-Albuterol  1 puff Inhalation Q6H  . linagliptin  5 mg Oral Daily  . losartan  100 mg Oral Daily  . methylPREDNISolone (SOLU-MEDROL) injection  80 mg Intravenous Q12H  . multivitamin with minerals  1 tablet Oral Daily  . zinc sulfate  220 mg Oral Daily   Continuous Infusions:    LOS: 8 days      Phillips Climes, MD [facility] Please refer to coverage provider listed on [email or web address] 09/25/2020,  3:01 PM

## 2020-09-25 NOTE — Plan of Care (Signed)
  Problem: Education: Goal: Knowledge of risk factors and measures for prevention of condition will improve Outcome: Progressing   Problem: Respiratory: Goal: Will maintain a patent airway Outcome: Progressing Goal: Complications related to the disease process, condition or treatment will be avoided or minimized Outcome: Progressing   Problem: Respiratory: Goal: Will maintain a patent airway Outcome: Progressing Goal: Complications related to the disease process, condition or treatment will be avoided or minimized Outcome: Progressing   Problem: Respiratory: Goal: Ability to maintain adequate ventilation will improve Outcome: Progressing Goal: Ability to maintain a clear airway will improve Outcome: Progressing

## 2020-09-25 NOTE — TOC Progression Note (Signed)
Transition of Care [facility]) - Progression Note    Patient Details  Name: Howard Sawyer MRN: [id number] Date of Birth: 24-Aug-1941  [facility] CM/SW [place], RN Phone Number: 09/25/2020, 10:09 AM  Clinical Narrative:    Damaris Schooner to Denice Paradise, Nurse for [facility]. Updated her on patient oxygen, PT recommendations.Plan would be trying to transfer to prison hospital once he is more stable. He cannot be discharged to regular prison on oxygen or with DME. Prison MD will be in touch with MD here to get updates. Continue to give information about progression to prison nurses as well.    Expected Discharge Plan: [facility] Barriers to Discharge: Continued Medical Work up  Expected Discharge Plan and Services Expected Discharge Plan: [place] hospital transfer                                               Social Determinants of Health (SDOH) Interventions    Readmission Risk Interventions No flowsheet data found.

## 2020-09-25 NOTE — Progress Notes (Signed)
Occupational Therapy Treatment Patient Details Name: Howard Sawyer MRN: [id number] DOB: 1941-10-21 Today's Date: 09/25/2020    History of present illness Pt is a 79 y.o. male admitted to ED on 09/17/20 from [facility] for covid PNA. PMH includes HTN, HLD, prostate cancer.   OT comments  Pt progressing towards established OT goals. Continues to present with decreased activity tolerance impacting safety and functional performance. Pt performing short distance mobility in room with Min guard A. Pt requiring significant time and SpO2 dropping to 81% on 8L. Pt requiring seated rest breaks to recover back to 90%. Pt requesting to return to bed; educating pt on benefits of prone position and sitting OOB at recliner. Pt willing to attempt prone position in bed. Continue to recommend no follow at DC and will continue to follow acutely as admitted.    Follow Up Recommendations  No OT follow up    Equipment Recommendations  Other (comment) (RW)    Recommendations for Other Services PT consult    Precautions / Restrictions Precautions Precautions: Fall;Other (comment) Precaution Comments: watch sats       Mobility Bed Mobility Overal bed mobility: Needs Assistance Bed Mobility: Rolling;Sit to Sidelying (sitting to prone) Rolling: Supervision   Supine to sit: Independent Sit to supine: Independent Sit to sidelying: Supervision General bed mobility comments: Cues and increased time for sidelying and then repositioning to semi prone postiion; hip flat on bed, but shoulder more in side lying  Transfers Overall transfer level: Needs assistance Equipment used: None Transfers: Sit to/from [facility] Sit to Stand: Min guard Stand pivot transfers: Min guard       General transfer comment: Min Guard A for safety    Balance Overall balance assessment: Needs assistance Sitting-balance support: No upper extremity supported;Feet unsupported Sitting  balance-[name] Scale: Good     Standing balance support: During functional activity;No upper extremity supported Standing balance-[name] Scale: Fair Standing balance comment: Able to maintain standing without UE support.                            ADL either performed or assessed with clinical judgement   ADL Overall ADL's : Needs assistance/impaired                         Toilet Transfer: Min guard;Ambulation;BSC Toilet Transfer Details (indicate cue type and reason): Min guard A for safety         Functional mobility during ADLs: Cueing for sequencing;Min guard General ADL Comments: Pt performing short distance mobility in room with Min Guard A. Not using RW this session to test balance and tolerance. Pt able to maintain balance without UE support; however, activity tolerance is poor and RW would benefit him greatly. Requiring several seated rest breaks during mobility.      Vision   Vision Assessment?: No apparent visual deficits   Perception     Praxis      Cognition Arousal/Alertness: Awake/alert Behavior During Therapy: WFL for tasks assessed/performed Overall Cognitive Status: Within Functional Limits for tasks assessed                                 General Comments: Pleasant and agreeable to therapy. receptive of education on prone positioning and willing to attempt        Exercises General Exercises - Lower Extremity Ankle Circles/Pumps: AROM;Both;10  reps;Supine Quad Sets: AROM;Both;10 reps;Supine Heel Slides: AROM;Right;Left;10 reps;Supine   Shoulder Instructions       General Comments SpO2 dropping to 81% on 8L during mobility. Able to recover back to 90% SpO2 with seated rest break. HR 90s and RR 22.     Pertinent Vitals/ Pain       Pain Assessment: Faces Faces Pain Scale: Hurts little more Pain Location: back Pain Descriptors / Indicators: Discomfort Pain Intervention(s): Monitored during session;Limited  activity within patient's tolerance;Repositioned  Home Living                                          Prior Functioning/Environment              Frequency  Min 2X/week        Progress Toward Goals  OT Goals(current goals can now be found in the care plan section)  Progress towards OT goals: Progressing toward goals  Acute Rehab OT Goals Patient Stated Goal: feel stronger OT Goal Formulation: With patient Time For Goal Achievement: 10/02/20 Potential to Achieve Goals: Good ADL Goals Pt Will Perform Grooming: with modified independence;standing;sitting Pt Will Transfer to Toilet: with modified independence;ambulating;regular height toilet Pt Will Perform Toileting - Clothing Manipulation and hygiene: with modified independence;sitting/lateral leans;sit to/from stand Additional ADL Goal #1: Pt will independently verbalize three energy conservation techniques for ADLs and IADLs Additional ADL Goal #2: Pt will independently monitor SpO2 and use purse lip breathing for ADLs  Plan Discharge plan remains appropriate    Co-evaluation    PT/OT/SLP Co-Evaluation/Treatment: Yes            AM-PAC OT "6 Clicks" Daily Activity     Outcome Measure   Help from another person eating meals?: A Little Help from another person taking care of personal grooming?: A Little Help from another person toileting, which includes using toliet, bedpan, or urinal?: A Little Help from another person bathing (including washing, rinsing, drying)?: A Little Help from another person to put on and taking off regular upper body clothing?: A Little Help from another person to put on and taking off regular lower body clothing?: A Little 6 Click Score: 18    End of Session Equipment Utilized During Treatment: Gait belt;Oxygen  OT Visit Diagnosis: Unsteadiness on feet (R26.81);Other abnormalities of gait and mobility (R26.89);Muscle weakness (generalized) (M62.81)   Activity  Tolerance Patient tolerated treatment well   Patient Left in bed;with call bell/phone within reach (semi-prone position)   Nurse Communication Mobility status;Other (comment) (O2)        Time: 0932-1000 OT Time Calculation (min): 28 min  Charges: OT General Charges $OT Visit: 1 Visit OT Treatments $Therapeutic Activity: 23-37 mins  [place], OTR/L Acute Rehab Pager: [phone] Office: [place] 09/25/2020, 2:31 PM

## 2020-09-26 DIAGNOSIS — J9601 Acute respiratory failure with hypoxia: Secondary | ICD-10-CM | POA: Diagnosis not present

## 2020-09-26 DIAGNOSIS — U071 COVID-19: Secondary | ICD-10-CM | POA: Diagnosis not present

## 2020-09-26 DIAGNOSIS — J1282 Pneumonia due to coronavirus disease 2019: Secondary | ICD-10-CM | POA: Diagnosis not present

## 2020-09-26 LAB — QUANTIFERON-TB GOLD PLUS: QuantiFERON-TB Gold Plus: UNDETERMINED — AB

## 2020-09-26 LAB — GLUCOSE, CAPILLARY
Glucose-Capillary: 144 mg/dL — ABNORMAL HIGH (ref 70–99)
Glucose-Capillary: 112 mg/dL — ABNORMAL HIGH (ref 70–99)
Glucose-Capillary: 304 mg/dL — ABNORMAL HIGH (ref 70–99)
Glucose-Capillary: 184 mg/dL — ABNORMAL HIGH (ref 70–99)

## 2020-09-26 LAB — CBC
HCT: 39.2 % (ref 39.0–52.0)
Hemoglobin: 13.2 g/dL (ref 13.0–17.0)
MCH: 31.3 pg (ref 26.0–34.0)
MCHC: 33.7 g/dL (ref 30.0–36.0)
MCV: 92.9 fL (ref 80.0–100.0)
Platelets: 339 10*3/uL (ref 150–400)
RBC: 4.22 MIL/uL (ref 4.22–5.81)
RDW: 13.2 % (ref 11.5–15.5)
WBC: 12.5 10*3/uL — ABNORMAL HIGH (ref 4.0–10.5)
nRBC: 0 % (ref 0.0–0.2)

## 2020-09-26 LAB — C-REACTIVE PROTEIN: CRP: 0.7 mg/dL (ref <1.0)

## 2020-09-26 LAB — COMPREHENSIVE METABOLIC PANEL
ALT: 39 U/L (ref 0–44)
AST: 19 U/L (ref 15–41)
Albumin: 2.2 g/dL — ABNORMAL LOW (ref 3.5–5.0)
Alkaline Phosphatase: 46 U/L (ref 38–126)
Anion gap: 7 (ref 5–15)
BUN: 30 mg/dL — ABNORMAL HIGH (ref 8–23)
CO2: 28 mmol/L (ref 22–32)
Calcium: 8.3 mg/dL — ABNORMAL LOW (ref 8.9–10.3)
Chloride: 102 mmol/L (ref 98–111)
Creatinine, Ser: 1.02 mg/dL (ref 0.61–1.24)
GFR, Estimated: >60 mL/min (ref >60)
Glucose, Bld: 231 mg/dL — ABNORMAL HIGH (ref 70–99)
Potassium: 4.5 mmol/L (ref 3.5–5.1)
Sodium: 137 mmol/L (ref 135–145)
Total Bilirubin: 0.8 mg/dL (ref 0.3–1.2)
Total Protein: 4.6 g/dL — ABNORMAL LOW (ref 6.5–8.1)

## 2020-09-26 LAB — QUANTIFERON-TB GOLD PLUS (RQFGPL)
QuantiFERON Mitogen Value: 0.14 IU/mL
QuantiFERON Nil Value: 0.02 IU/mL
QuantiFERON TB1 Ag Value: 0.03 IU/mL
QuantiFERON TB2 Ag Value: 0.04 IU/mL

## 2020-09-26 LAB — D-DIMER, QUANTITATIVE: D-Dimer, Quant: 0.38 ug/mL-FEU (ref 0.00–0.50)

## 2020-09-26 MED ORDER — FUROSEMIDE 10 MG/ML IJ SOLN
40.0000 mg | Freq: Once | INTRAMUSCULAR | Status: AC
  Administered 2020-09-26: 40 mg via INTRAVENOUS
  Filled 2020-09-26: qty 4

## 2020-09-26 MED ORDER — PREDNISONE 20 MG PO TABS
20.0000 mg | ORAL_TABLET | Freq: Every day | ORAL | Status: DC
  Administered 2020-09-27 – 2020-09-28 (×2): 20 mg via ORAL
  Filled 2020-09-26 (×2): qty 1

## 2020-09-26 MED ORDER — PANTOPRAZOLE SODIUM 40 MG PO TBEC
40.0000 mg | DELAYED_RELEASE_TABLET | Freq: Every day | ORAL | Status: DC
  Administered 2020-09-26 – 2020-09-28 (×3): 40 mg via ORAL
  Filled 2020-09-26 (×3): qty 1

## 2020-09-26 MED ORDER — PREDNISONE 20 MG PO TABS: 40.0000 mg | ORAL_TABLET | Freq: Every day | ORAL | Status: DC

## 2020-09-26 NOTE — Progress Notes (Signed)
PROGRESS NOTE    Howard Sawyer  SAY:[id number] DOB: 05-May-1941 DOA: 09/17/2020 PCP: Asencion Noble, MD    Brief Narrative:   Howard Sawyer is a 79 year-old male with past medical history notable for essential hypertension, hyperlipidemia, diverticulosis, prostate cancer who presented to the emergency department with progressive shortness of breath, weakness, poor appetite, cough fever.  Patient was sent in from the [facility] due to positive Covid test and hypoxia.  On EMS arrival, his oxygen saturation was noted to be in the mid 80s.  In the ED, temperature 98.5, HR 56, RR 16, BP 104/86, SPO2 low 80s on room air improved to mid 90s on 4 L nasal cannula.  Sodium 128, glucose 153, AST 228, ALT 24.  CRP 2.9, procalcitonin 0.14, D-dimer 0.3.  Chest x-ray with bilateral airspace infiltrates consistent with multifocal pneumonia.  Remdesivir and Decadron were initiated in the ED.  Hospital service was consulted for further evaluation and treatment of acute hypoxic respiratory failure secondary to Covid-19 viral pneumonia.  Subjective:  Patient reports he is feeling better today, dyspnea and cough has improved.  Assessment & Plan:   Principal Problem:   Pneumonia due to COVID-19 virus Active Problems:   Essential hypertension   Hyponatremia   Transaminitis   Acute hypoxic respiratory failure secondary to acute Covid-19 viral pneumonia during the ongoing 2020/2021 Covid 19 Pandemic  -Unfortunately patient is unvaccinated. -He does appear to be having severe COVID-19 pneumonia (present on admission. -He is treated with IV Solu-Medrol, now respiratory status has been improving, he is transition to p.o. prednisone. -He is treated with IV remdesivir. -Continue with baricitinib -With improved oxygen requirement this morning, he is on 4 L high flow nasal cannula.   -Encouraged with incentive spirometer and flutter valve.    SpO2: 92 % O2 Flow Rate (L/min): 3 L/min  The  treatment plan and use of medications and known side effects were discussed with patient/family. Some of the medications used are based on case reports/anecdotal data.  All other medications being used in the management of COVID-19 based on limited study data.  Complete risks and long-term side effects are unknown, however in the best clinical judgment they seem to be of some benefit.  Patient wanted to proceed with treatment options provided.  Hyponatremia - resolved  Hyperglycemia Hemoglobin A1c 5.9. Likely induced by IV steriods. --Levemir 8u [place] daily --ISS --CBGs qAC/HS  Moderate Protein calorie malnutrition Body mass index is 24.33 kg/m. --nutrition following --Continue supplementation with Ensure 3 times daily, multivitamin  Transaminitis - Etiology likely secondary to Covid-19 viral infection.  Acute hepatitis panel negative. -Trending down  Essential hypertension --Losartan 100 mg p.o. daily --Aspirin 81 mg p.o. daily  Weakness/debility/deconditioning --PT/OT following  DVT prophylaxis: Lovenox Code Status: Full code Family Communication: Updated patient extensively at bedside, as well I have discussed with nurse station at [facility] [phone], on 10/20, as well unable to update any family members per  [facility] facility policy. -I have discussed with Dr. Perley Jain at [facility] [place] at [phone] on 10/21, and I have discussed again with [facility]  on 10/23, and to they have no available Covid beds, he reports possibly by Monday did have an available bed.  They will let us know sooner if there is any bed available. Disposition Plan:  Status is: Inpatient  Remains inpatient appropriate because:Hemodynamically unstable, Unsafe d/c plan and IV treatments appropriate due to intensity of illness or inability to take  PO   Dispo: The patient is from: [facility]              Anticipated d/c is to: [facility], probably by Monday              Anticipated d/c date is: 3 days              Patient currently is not medically stable to d/c.   Consultants:   None  Procedures:   None  Antimicrobials:   None     Objective: Vitals:   09/25/20 1932 09/26/20 0028 09/26/20 0313 09/26/20 1330  BP: 137/63 112/63 (!) 126/54 (!) 137/54  Pulse: 75 76 88 67  Resp: 17 19 (!) 22 20  Temp: 98.4 F (36.9 C) 97.7 F (36.5 C) 97.9 F (36.6 C) 98.6 F (37 C)  TempSrc: Oral Oral Oral Axillary  SpO2: 91% 90% (!) 87% 92%  Weight:      Height:        Intake/Output Summary (Last 24 hours) at 09/26/2020 1358 Last data filed at 09/26/2020 1240 Gross per 24 hour  Intake 240 ml  Output 300 ml  Net -60 ml   Filed Weights   09/17/20 0015  Weight: 72.6 kg    Examination:  Awake Alert, Oriented X 3, frail, no new F.N deficits, Normal affect Symmetrical Chest wall movement, Good air movement bilaterally, scattered rales RRR,No Gallops,Rubs or new Murmurs, No Parasternal Heave +ve B.Sounds, Abd Soft, No tenderness, No rebound - guarding or rigidity. No Cyanosis, Clubbing or edema, No new Rash or bruise     Data Reviewed: I have personally reviewed following labs and imaging studies  CBC: Recent Labs  Lab 09/20/20 0250 09/20/20 0250 09/21/20 0409 09/21/20 0409 09/22/20 0054 09/23/20 0124 09/24/20 0721 09/25/20 0232 09/26/20 0307  WBC 8.9   < > 9.7   < > 13.8* 16.5* 11.4* 10.5 12.5*  NEUTROABS 7.3  --  8.6*  --  12.0*  --   --   --   --   HGB 14.0   < > 13.3   < > 14.7 15.4 15.1 12.8* 13.2  HCT 42.0   < > 39.4   < > 42.6 44.4 45.2 39.2 39.2  MCV 89.9   < > 89.7   < > 89.9 90.1 90.9 92.9 92.9  PLT 299   < > 310   < > 304 404* 352 301 339   < > = values in this interval not displayed.   Basic Metabolic Panel: Recent Labs  Lab 09/20/20 0250 09/20/20 0250 09/21/20 0409 09/21/20 0409 09/22/20 0054 09/23/20 0124 09/24/20 0721 09/25/20 0232  09/26/20 0307  NA 137   < > 137   < > 137 138 139 136 137  K 4.4   < > 4.7   < > 4.2 4.2 4.1 4.5 4.5  CL 100   < > 103   < > 101 101 100 101 102  CO2 29   < > 26   < > 25 25 29 28 28   GLUCOSE 194*   < > 242*   < > 193* 178* 149* 189* 231*  BUN 24*   < > 27*   < > 24* 26* 20 27* 30*  CREATININE 1.01   < > 0.87   < > 0.97 1.00 0.99 0.99 1.02  CALCIUM 8.4*   < > 8.2*   < > 8.3* 8.9 8.9 8.2* 8.3*  MG  2.3  --  2.2  --  2.1  --   --   --   --    < > = values in this interval not displayed.   GFR: Estimated Creatinine Clearance: 58.7 mL/min (by C-G formula based on SCr of 1.02 mg/dL). Liver Function Tests: Recent Labs  Lab 09/22/20 0054 09/23/20 0124 09/24/20 0721 09/25/20 0232 09/26/20 0307  AST 31 26 32 22 19  ALT 96* 79* 69* 40 39  ALKPHOS 55 62 71 44 46  BILITOT 0.7 0.7 1.1 0.8 0.8  PROT 5.1* 5.7* 6.4* 4.7* 4.6*  ALBUMIN 2.7* 2.9* 3.1* 2.2* 2.2*   No results for input(s): LIPASE, AMYLASE in the last 168 hours. No results for input(s): AMMONIA in the last 168 hours. Coagulation Profile: No results for input(s): INR, PROTIME in the last 168 hours. Cardiac Enzymes: No results for input(s): CKTOTAL, CKMB, CKMBINDEX, TROPONINI in the last 168 hours. BNP (last 3 results) No results for input(s): PROBNP in the last 8760 hours. HbA1C: No results for input(s): HGBA1C in the last 72 hours. CBG: Recent Labs  Lab 09/25/20 1139 09/25/20 1608 09/25/20 2142 09/26/20 0658 09/26/20 1242  GLUCAP 163* 133* 83 144* 112*   Lipid Profile: No results for input(s): CHOL, HDL, LDLCALC, TRIG, CHOLHDL, LDLDIRECT in the last 72 hours. Thyroid Function Tests: No results for input(s): TSH, T4TOTAL, FREET4, T3FREE, THYROIDAB in the last 72 hours. Anemia Panel: No results for input(s): VITAMINB12, FOLATE, FERRITIN, TIBC, IRON, RETICCTPCT in the last 72 hours. Sepsis Labs: Recent Labs  Lab 09/24/20 0721 09/25/20 0232  PROCALCITON <0.10 <0.10    Recent Results (from the past 240 hour(s))    Respiratory Panel by RT PCR (Flu A&B, Covid) - Nasopharyngeal Swab     Status: Abnormal   Collection Time: 09/17/20 12:19 AM   Specimen: Nasopharyngeal Swab  Result Value Ref Range Status   SARS Coronavirus 2 by RT PCR POSITIVE (A) NEGATIVE Final    Comment: RESULT CALLED TO, READ BACK BY AND VERIFIED WITH: T EASTER,RN@[id number]  09/17/20 MKELLY (NOTE) SARS-CoV-2 target nucleic acids are DETECTED.  SARS-CoV-2 RNA is generally detectable in upper respiratory specimens  during the acute phase of infection. Positive results are indicative of the presence of the identified virus, but do not rule out bacterial infection or co-infection with other pathogens not detected by the test. Clinical correlation with patient history and other diagnostic information is necessary to determine patient infection status. The expected result is Negative.  Fact Sheet for Patients:  PinkCheek.be  Fact Sheet for Healthcare Providers: GravelBags.it  This test is not yet approved or cleared by the [place] FDA and  has been authorized for detection and/or diagnosis of SARS-CoV-2 by FDA under an Emergency Use Authorization (EUA).  This EUA will remain in effect (meaning this test can be [place] ed) for the duration of  the COVID-19 declaration under Section 564(b)(1) of the Act, 21 U.S.C. section 360bbb-3(b)(1), unless the authorization is terminated or revoked sooner.      Influenza A by PCR NEGATIVE NEGATIVE Final   Influenza B by PCR NEGATIVE NEGATIVE Final    Comment: (NOTE) The Xpert Xpress SARS-CoV-2/FLU/RSV assay is intended as an aid in  the diagnosis of influenza from Nasopharyngeal swab specimens and  should not be used as a sole basis for treatment. Nasal washings and  aspirates are unacceptable for Xpert Xpress SARS-CoV-2/FLU/RSV  testing.  Fact Sheet for Patients: PinkCheek.be  Fact Sheet for Healthcare  Providers: GravelBags.it  This test is not  yet approved or cleared by the [place] and  has been authorized for detection and/or diagnosis of SARS-CoV-2 by  FDA under an Emergency Use Authorization (EUA). This EUA will remain  in effect (meaning this test can be used) for the duration of the  Covid-19 declaration under Section 564(b)(1) of the Act, 21  U.S.C. section 360bbb-3(b)(1), unless the authorization is  terminated or revoked. Performed at [facility], [address], [place], [place] [address]   Blood Culture (routine x 2)     Status: None   Collection Time: 09/17/20  1:18 AM   Specimen: BLOOD  Result Value Ref Range Status   Specimen Description BLOOD  Final   Special Requests NONE  Final   Culture   Final    NO GROWTH 5 DAYS Performed at [facility], [address], [place], [place] [address]    Report Status 09/22/2020 FINAL  Final  Blood Culture (routine x 2)     Status: None   Collection Time: 09/17/20  1:21 AM   Specimen: BLOOD  Result Value Ref Range Status   Specimen Description BLOOD RIGHT ANTECUBITAL  Final   Special Requests   Final    BOTTLES DRAWN AEROBIC AND ANAEROBIC Blood Culture adequate volume   Culture   Final    NO GROWTH 5 DAYS Performed at [facility], [address] [place],  [address]    Report Status 09/22/2020 FINAL  Final         Radiology Studies: No results found.      Scheduled Meds: . vitamin C  500 mg Oral Daily  . aspirin EC  81 mg Oral Daily  . baricitinib  4 mg Oral Daily  . enoxaparin (LOVENOX) injection  40 mg Subcutaneous Q24H  . feeding supplement  237 mL Oral TID BM  . insulin aspart  0-5 Units Subcutaneous QHS  . insulin aspart  0-9 Units Subcutaneous TID WC  . insulin aspart  4 Units Subcutaneous TID WC  . insulin detemir  16 Units Subcutaneous Daily  . Ipratropium-Albuterol  1 puff Inhalation Q6H  . linagliptin  5 mg Oral Daily  . losartan  100 mg Oral Daily  .  multivitamin with minerals  1 tablet Oral Daily  . [START ON 09/27/2020] predniSONE  20 mg Oral Q breakfast  . zinc sulfate  220 mg Oral Daily   Continuous Infusions:    LOS: 9 days      Phillips Climes, MD [facility] Please refer to coverage provider listed on [email or web address] 09/26/2020, 1:58 PM

## 2020-09-27 DIAGNOSIS — J9601 Acute respiratory failure with hypoxia: Secondary | ICD-10-CM | POA: Diagnosis not present

## 2020-09-27 DIAGNOSIS — J1282 Pneumonia due to coronavirus disease 2019: Secondary | ICD-10-CM | POA: Diagnosis not present

## 2020-09-27 DIAGNOSIS — U071 COVID-19: Secondary | ICD-10-CM | POA: Diagnosis not present

## 2020-09-27 LAB — COMPREHENSIVE METABOLIC PANEL
ALT: 30 U/L (ref 0–44)
AST: 15 U/L (ref 15–41)
Albumin: 2 g/dL — ABNORMAL LOW (ref 3.5–5.0)
Alkaline Phosphatase: 43 U/L (ref 38–126)
Anion gap: 5 (ref 5–15)
BUN: 32 mg/dL — ABNORMAL HIGH (ref 8–23)
CO2: 32 mmol/L (ref 22–32)
Calcium: 8.1 mg/dL — ABNORMAL LOW (ref 8.9–10.3)
Chloride: 102 mmol/L (ref 98–111)
Creatinine, Ser: 1.05 mg/dL (ref 0.61–1.24)
GFR, Estimated: >60 mL/min (ref >60)
Glucose, Bld: 87 mg/dL (ref 70–99)
Potassium: 3.9 mmol/L (ref 3.5–5.1)
Sodium: 139 mmol/L (ref 135–145)
Total Bilirubin: 0.6 mg/dL (ref 0.3–1.2)
Total Protein: 4.4 g/dL — ABNORMAL LOW (ref 6.5–8.1)

## 2020-09-27 LAB — CBC
HCT: 38.1 % — ABNORMAL LOW (ref 39.0–52.0)
Hemoglobin: 12.8 g/dL — ABNORMAL LOW (ref 13.0–17.0)
MCH: 31.1 pg (ref 26.0–34.0)
MCHC: 33.6 g/dL (ref 30.0–36.0)
MCV: 92.7 fL (ref 80.0–100.0)
Platelets: 294 10*3/uL (ref 150–400)
RBC: 4.11 MIL/uL — ABNORMAL LOW (ref 4.22–5.81)
RDW: 13 % (ref 11.5–15.5)
WBC: 14.4 10*3/uL — ABNORMAL HIGH (ref 4.0–10.5)
nRBC: 0 % (ref 0.0–0.2)

## 2020-09-27 LAB — GLUCOSE, CAPILLARY
Glucose-Capillary: 62 mg/dL — ABNORMAL LOW (ref 70–99)
Glucose-Capillary: 78 mg/dL (ref 70–99)
Glucose-Capillary: 155 mg/dL — ABNORMAL HIGH (ref 70–99)
Glucose-Capillary: 121 mg/dL — ABNORMAL HIGH (ref 70–99)

## 2020-09-27 LAB — D-DIMER, QUANTITATIVE: D-Dimer, Quant: 0.45 ug/mL-FEU (ref 0.00–0.50)

## 2020-09-27 LAB — C-REACTIVE PROTEIN: CRP: 0.6 mg/dL (ref <1.0)

## 2020-09-27 MED ORDER — INSULIN DETEMIR 100 UNIT/ML ~~LOC~~ SOLN
12.0000 [IU] | Freq: Every day | SUBCUTANEOUS | Status: DC
  Filled 2020-09-27: qty 0.12

## 2020-09-27 MED ORDER — IPRATROPIUM-ALBUTEROL 20-100 MCG/ACT IN AERS
1.0000 | INHALATION_SPRAY | Freq: Two times a day (BID) | RESPIRATORY_TRACT | Status: DC
  Administered 2020-09-27 – 2020-09-28 (×2): 1 via RESPIRATORY_TRACT
  Filled 2020-09-27 (×2): qty 4

## 2020-09-27 MED ORDER — INSULIN ASPART 100 UNIT/ML ~~LOC~~ SOLN
0.0000 [IU] | Freq: Three times a day (TID) | SUBCUTANEOUS | Status: DC
  Administered 2020-09-27: 2 [IU] via SUBCUTANEOUS
  Administered 2020-09-28: 1 [IU] via SUBCUTANEOUS

## 2020-09-27 NOTE — Plan of Care (Signed)
Patient is currently resting in bed. Denies pain. OOB to BSC. VSS, remains on 5L [place]. Call bell within reach. Bed alarm on . Guard in room.   Problem: Education: Goal: Knowledge of risk factors and measures for prevention of condition will improve Outcome: Progressing   Problem: Coping: Goal: Psychosocial and spiritual needs will be supported Outcome: Progressing   Problem: Respiratory: Goal: Will maintain a patent airway Outcome: Progressing Goal: Complications related to the disease process, condition or treatment will be avoided or minimized Outcome: Progressing   Problem: Education: Goal: Knowledge of risk factors and measures for prevention of condition will improve Outcome: Progressing   Problem: Coping: Goal: Psychosocial and spiritual needs will be supported Outcome: Progressing   Problem: Respiratory: Goal: Will maintain a patent airway Outcome: Progressing Goal: Complications related to the disease process, condition or treatment will be avoided or minimized Outcome: Progressing   Problem: Activity: Goal: Ability to tolerate increased activity will improve Outcome: Progressing   Problem: Clinical Measurements: Goal: Ability to maintain a body temperature in the normal range will improve Outcome: Progressing   Problem: Respiratory: Goal: Ability to maintain adequate ventilation will improve Outcome: Progressing Goal: Ability to maintain a clear airway will improve Outcome: Progressing   Problem: Education: Goal: Knowledge of General Education information will improve Description: Including pain rating scale, medication(s)/side effects and non-pharmacologic comfort measures Outcome: Progressing   Problem: Health Behavior/Discharge Planning: Goal: Ability to manage health-related needs will improve Outcome: Progressing   Problem: Clinical Measurements: Goal: Respiratory complications will improve Outcome: Progressing

## 2020-09-27 NOTE — Progress Notes (Signed)
PROGRESS NOTE    Howard Sawyer  LFY:[id number] DOB: 07-07-1941 DOA: 09/17/2020 PCP: Asencion Noble, MD    Brief Narrative:   Howard Sawyer is a 79 year-old male with past medical history notable for essential hypertension, hyperlipidemia, diverticulosis, prostate cancer who presented to the emergency department with progressive shortness of breath, weakness, poor appetite, cough fever.  Patient was sent in from the [facility] due to positive Covid test and hypoxia.  On EMS arrival, his oxygen saturation was noted to be in the mid 80s.  In the ED, temperature 98.5, HR 56, RR 16, BP 104/86, SPO2 low 80s on room air improved to mid 90s on 4 L nasal cannula.  Sodium 128, glucose 153, AST 228, ALT 24.  CRP 2.9, procalcitonin 0.14, D-dimer 0.3.  Chest x-ray with bilateral airspace infiltrates consistent with multifocal pneumonia.  Remdesivir and Decadron were initiated in the ED.  Hospital service was consulted for further evaluation and treatment of acute hypoxic respiratory failure secondary to Covid-19 viral pneumonia.  Subjective:  Patient reports dyspnea has improved, cough has improved as well, reports generalized weakness .   Assessment & Plan:   Principal Problem:   Pneumonia due to COVID-19 virus Active Problems:   Essential hypertension   Hyponatremia   Transaminitis   Acute hypoxic respiratory failure secondary to acute Covid-19 viral pneumonia during the ongoing 2020/2021 Covid 19 Pandemic  -Unfortunately patient is unvaccinated. -He does appear to be having severe COVID-19 pneumonia (present on admission. -He is treated with IV Solu-Medrol, now respiratory status has been improving, he is transition to p.o. prednisone. -He is treated with IV remdesivir. -Continue with baricitinib -Patient with fluctuating requirement, but overall has improved over last 48 hours, this morning he is between 4 to 6 L nasal cannula . -Encouraged with incentive spirometer and  flutter valve.    SpO2: 93 % O2 Flow Rate (L/min): 5 L/min  The treatment plan and use of medications and known side effects were discussed with patient/family. Some of the medications used are based on case reports/anecdotal data.  All other medications being used in the management of COVID-19 based on limited study data.  Complete risks and long-term side effects are unknown, however in the best clinical judgment they seem to be of some benefit.  Patient wanted to proceed with treatment options provided.  Hyponatremia - resolved  Hyperglycemia Hemoglobin A1c 5.9. Likely induced by IV steriods. --Levemir 8u [place] daily --ISS --CBGs qAC/HS  Moderate Protein calorie malnutrition Body mass index is 24.33 kg/m. --nutrition following --Continue supplementation with Ensure 3 times daily, multivitamin  Transaminitis - Etiology likely secondary to Covid-19 viral infection.  Acute hepatitis panel negative. -Trending down  Essential hypertension --Losartan 100 mg p.o. daily --Aspirin 81 mg p.o. daily  Weakness/debility/deconditioning --PT/OT following  DVT prophylaxis: Lovenox Code Status: Full code Family Communication: Updated patient extensively at bedside, as well I have discussed with nurse station at [facility] [phone], on 10/20, as well unable to update any family members per  [facility] facility policy. -I have discussed with Dr. Perley Jain at [place] [facility] [place] [facility] at [phone] on 10/21, and I have discussed again with [facility] Sc[facility]yl [facility]  on 10/23, and to they have no available Covid beds, he reports possibly by Monday did have an available bed.  They will let us know sooner if there is any bed available. Disposition Plan:  Status is: Inpatient  Remains inpatient appropriate because:Hemodynamically unstable, Unsafe d/c plan and IV treatments  appropriate due to intensity of illness or inability to take PO   Dispo: The patient is from:  [facility]              Anticipated d/c is to: [facility], probably by Monday              Anticipated d/c date is: 3 days              Patient currently is not medically stable to d/c.   Consultants:   None  Procedures:   None  Antimicrobials:   None     Objective: Vitals:   09/27/20 0416 09/27/20 0446 09/27/20 0715 09/27/20 1209  BP: 114/68  (!) 109/51 (!) 123/59  Pulse: 60  63 61  Resp: 15  18 18   Temp: 98.5 F (36.9 C)  98.5 F (36.9 C) 98.7 F (37.1 C)  TempSrc: Oral  Oral Oral  SpO2: (!) 89% 93% 90% 93%  Weight:      Height:        Intake/Output Summary (Last 24 hours) at 09/27/2020 1217 Last data filed at 09/26/2020 1240 Gross per 24 hour  Intake 240 ml  Output 300 ml  Net -60 ml   Filed Weights   09/17/20 0015  Weight: 72.6 kg    Examination:  Awake Alert, Oriented X 3, frail, no new F.N deficits, Normal affect Symmetrical Chest wall movement, Good air movement bilaterally, scattered rales RRR,No Gallops,Rubs or new Murmurs, No Parasternal Heave +ve B.Sounds, Abd Soft, No tenderness, No rebound - guarding or rigidity. No Cyanosis, Clubbing or edema, No new Rash or bruise      Data Reviewed: I have personally reviewed following labs and imaging studies  CBC: Recent Labs  Lab 09/21/20 0409 09/21/20 0409 09/22/20 0054 09/22/20 0054 09/23/20 0124 09/24/20 0721 09/25/20 0232 09/26/20 0307 09/27/20 0420  WBC 9.7   < > 13.8*   < > 16.5* 11.4* 10.5 12.5* 14.4*  NEUTROABS 8.6*  --  12.0*  --   --   --   --   --   --   HGB 13.3   < > 14.7   < > 15.4 15.1 12.8* 13.2 12.8*  HCT 39.4   < > 42.6   < > 44.4 45.2 39.2 39.2 38.1*  MCV 89.7   < > 89.9   < > 90.1 90.9 92.9 92.9 92.7  PLT 310   < > 304   < > 404* 352 301 339 294   < > = values in this interval not displayed.   Basic Metabolic Panel: Recent Labs  Lab 09/21/20 0409 09/21/20 0409 09/22/20 0054 09/22/20 0054 09/23/20 0124  09/24/20 0721 09/25/20 0232 09/26/20 0307 09/27/20 0420  NA 137   < > 137   < > 138 139 136 137 139  K 4.7   < > 4.2   < > 4.2 4.1 4.5 4.5 3.9  CL 103   < > 101   < > 101 100 101 102 102  CO2 26   < > 25   < > 25 29 28 28  32  GLUCOSE 242*   < > 193*   < > 178* 149* 189* 231* 87  BUN 27*   < > 24*   < > 26* 20 27* 30* 32*  CREATININE 0.87   < > 0.97   < > 1.00 0.99 0.99 1.02 1.05  CALCIUM 8.2*   < > 8.3*   < >  8.9 8.9 8.2* 8.3* 8.1*  MG 2.2  --  2.1  --   --   --   --   --   --    < > = values in this interval not displayed.   GFR: Estimated Creatinine Clearance: 57 mL/min (by C-G formula based on SCr of 1.05 mg/dL). Liver Function Tests: Recent Labs  Lab 09/23/20 0124 09/24/20 0721 09/25/20 0232 09/26/20 0307 09/27/20 0420  AST 26 32 22 19 15   ALT 79* 69* 40 39 30  ALKPHOS 62 71 44 46 43  BILITOT 0.7 1.1 0.8 0.8 0.6  PROT 5.7* 6.4* 4.7* 4.6* 4.4*  ALBUMIN 2.9* 3.1* 2.2* 2.2* 2.0*   No results for input(s): LIPASE, AMYLASE in the last 168 hours. No results for input(s): AMMONIA in the last 168 hours. Coagulation Profile: No results for input(s): INR, PROTIME in the last 168 hours. Cardiac Enzymes: No results for input(s): CKTOTAL, CKMB, CKMBINDEX, TROPONINI in the last 168 hours. BNP (last 3 results) No results for input(s): PROBNP in the last 8760 hours. HbA1C: No results for input(s): HGBA1C in the last 72 hours. CBG: Recent Labs  Lab 09/26/20 1242 09/26/20 1618 09/26/20 2057 09/27/20 0809 09/27/20 1202  GLUCAP 112* 304* 184* 62* 78   Lipid Profile: No results for input(s): CHOL, HDL, LDLCALC, TRIG, CHOLHDL, LDLDIRECT in the last 72 hours. Thyroid Function Tests: No results for input(s): TSH, T4TOTAL, FREET4, T3FREE, THYROIDAB in the last 72 hours. Anemia Panel: No results for input(s): VITAMINB12, FOLATE, FERRITIN, TIBC, IRON, RETICCTPCT in the last 72 hours. Sepsis Labs: Recent Labs  Lab 09/24/20 0721 09/25/20 0232  PROCALCITON <0.10 <0.10    No  results found for this or any previous visit (from the past 240 hour(s)).       Radiology Studies: No results found.      Scheduled Meds: . vitamin C  500 mg Oral Daily  . aspirin EC  81 mg Oral Daily  . baricitinib  4 mg Oral Daily  . enoxaparin (LOVENOX) injection  40 mg Subcutaneous Q24H  . feeding supplement  237 mL Oral TID BM  . insulin aspart  0-5 Units Subcutaneous QHS  . insulin aspart  0-9 Units Subcutaneous TID WC  . insulin aspart  4 Units Subcutaneous TID WC  . [START ON 09/28/2020] insulin detemir  12 Units Subcutaneous Daily  . Ipratropium-Albuterol  1 puff Inhalation BID  . linagliptin  5 mg Oral Daily  . losartan  100 mg Oral Daily  . multivitamin with minerals  1 tablet Oral Daily  . pantoprazole  40 mg Oral Daily  . predniSONE  20 mg Oral Q breakfast  . zinc sulfate  220 mg Oral Daily   Continuous Infusions:    LOS: 10 days      Phillips Climes, MD [facility] Please refer to coverage provider listed on [email or web address] 09/27/2020, 12:17 PM

## 2020-09-28 DIAGNOSIS — J1282 Pneumonia due to coronavirus disease 2019: Secondary | ICD-10-CM | POA: Diagnosis not present

## 2020-09-28 DIAGNOSIS — U071 COVID-19: Secondary | ICD-10-CM | POA: Diagnosis not present

## 2020-09-28 DIAGNOSIS — J9601 Acute respiratory failure with hypoxia: Secondary | ICD-10-CM | POA: Diagnosis not present

## 2020-09-28 DIAGNOSIS — I10 Essential (primary) hypertension: Secondary | ICD-10-CM | POA: Diagnosis not present

## 2020-09-28 LAB — COMPREHENSIVE METABOLIC PANEL
ALT: 29 U/L (ref 0–44)
AST: 17 U/L (ref 15–41)
Albumin: 2.2 g/dL — ABNORMAL LOW (ref 3.5–5.0)
Alkaline Phosphatase: 48 U/L (ref 38–126)
Anion gap: 6 (ref 5–15)
BUN: 32 mg/dL — ABNORMAL HIGH (ref 8–23)
CO2: 31 mmol/L (ref 22–32)
Calcium: 8.1 mg/dL — ABNORMAL LOW (ref 8.9–10.3)
Chloride: 102 mmol/L (ref 98–111)
Creatinine, Ser: 1.19 mg/dL (ref 0.61–1.24)
GFR, Estimated: >60 mL/min (ref >60)
Glucose, Bld: 91 mg/dL (ref 70–99)
Potassium: 4.1 mmol/L (ref 3.5–5.1)
Sodium: 139 mmol/L (ref 135–145)
Total Bilirubin: 0.7 mg/dL (ref 0.3–1.2)
Total Protein: 4.7 g/dL — ABNORMAL LOW (ref 6.5–8.1)

## 2020-09-28 LAB — CBC
HCT: 38.9 % — ABNORMAL LOW (ref 39.0–52.0)
Hemoglobin: 12.7 g/dL — ABNORMAL LOW (ref 13.0–17.0)
MCH: 30.5 pg (ref 26.0–34.0)
MCHC: 32.6 g/dL (ref 30.0–36.0)
MCV: 93.3 fL (ref 80.0–100.0)
Platelets: 257 10*3/uL (ref 150–400)
RBC: 4.17 MIL/uL — ABNORMAL LOW (ref 4.22–5.81)
RDW: 12.9 % (ref 11.5–15.5)
WBC: 11 10*3/uL — ABNORMAL HIGH (ref 4.0–10.5)
nRBC: 0 % (ref 0.0–0.2)

## 2020-09-28 LAB — GLUCOSE, CAPILLARY
Glucose-Capillary: 64 mg/dL — ABNORMAL LOW (ref 70–99)
Glucose-Capillary: 94 mg/dL (ref 70–99)
Glucose-Capillary: 129 mg/dL — ABNORMAL HIGH (ref 70–99)

## 2020-09-28 LAB — C-REACTIVE PROTEIN: CRP: 0.9 mg/dL (ref <1.0)

## 2020-09-28 LAB — D-DIMER, QUANTITATIVE: D-Dimer, Quant: 0.81 ug/mL-FEU — ABNORMAL HIGH (ref 0.00–0.50)

## 2020-09-28 MED ORDER — INSULIN DETEMIR 100 UNIT/ML ~~LOC~~ SOLN: 8.0000 [IU] | Freq: Every day | SUBCUTANEOUS | 11 refills | Status: DC

## 2020-09-28 MED ORDER — ENSURE ENLIVE PO LIQD: 237.0000 mL | Freq: Three times a day (TID) | ORAL | 12 refills | Status: AC

## 2020-09-28 MED ORDER — GUAIFENESIN-DM 100-10 MG/5ML PO SYRP: 10.0000 mL | ORAL_SOLUTION | ORAL | 0 refills | Status: AC | PRN

## 2020-09-28 MED ORDER — ALBUTEROL SULFATE HFA 108 (90 BASE) MCG/ACT IN AERS: 2.0000 | INHALATION_SPRAY | Freq: Four times a day (QID) | RESPIRATORY_TRACT | Status: AC | PRN

## 2020-09-28 MED ORDER — PREDNISONE 20 MG PO TABS: 20.0000 mg | ORAL_TABLET | Freq: Every day | ORAL | Status: AC

## 2020-09-28 MED ORDER — ACETAMINOPHEN 325 MG PO TABS: 650.0000 mg | ORAL_TABLET | Freq: Four times a day (QID) | ORAL | Status: AC | PRN

## 2020-09-28 MED ORDER — PANTOPRAZOLE SODIUM 40 MG PO TBEC: 40.0000 mg | DELAYED_RELEASE_TABLET | Freq: Every day | ORAL | Status: AC

## 2020-09-28 MED ORDER — INSULIN ASPART 100 UNIT/ML ~~LOC~~ SOLN: 0.0000 [IU] | Freq: Every day | SUBCUTANEOUS | 11 refills | Status: AC

## 2020-09-28 MED ORDER — INSULIN DETEMIR 100 UNIT/ML ~~LOC~~ SOLN
8.0000 [IU] | Freq: Every day | SUBCUTANEOUS | Status: DC
  Administered 2020-09-28: 8 [IU] via SUBCUTANEOUS
  Filled 2020-09-28: qty 0.08

## 2020-09-28 MED ORDER — INSULIN ASPART 100 UNIT/ML ~~LOC~~ SOLN
0.0000 [IU] | Freq: Three times a day (TID) | SUBCUTANEOUS | 11 refills | Status: AC
Start: 2020-09-28 — End: ?

## 2020-09-28 MED ORDER — IPRATROPIUM-ALBUTEROL 20-100 MCG/ACT IN AERS: 1.0000 | INHALATION_SPRAY | Freq: Two times a day (BID) | RESPIRATORY_TRACT | Status: AC

## 2020-09-28 NOTE — Care Management Important Message (Signed)
Important Message  Patient Details  Name: ATTIKUS BARTOSZEK MRN: [id number] Date of Birth: 04-13-1941   Medicare Important Message Given:  Yes - Important Message mailed due to current National Emergency  Verbal consent obtained due to current National Emergency  Relationship to patient: Self Contact Name: Loring Liskey Call Date: 09/28/20  Time: [place] Phone: [phone] Outcome: No Answer/Busy Important Message mailed to: Patient address on file    Delorse Lek 09/28/2020, 3:36 PM

## 2020-09-28 NOTE — Progress Notes (Addendum)
Physical Therapy Treatment Patient Details Name: Howard Sawyer MRN: [id number] DOB: August 28, 1941 Today's Date: 09/28/2020    History of Present Illness Pt is a 79 y.o. male admitted to ED on 09/17/20 from [facility] for covid PNA. PMH includes HTN, HLD, prostate cancer.    PT Comments    Pt received in recliner, pleasant and eager to mobilize. He required min guard assist transfers and ambulation 10' x 2 without AD. Distance limited by fatigue/desat. Session focusing on amb without AD due to pt is incarcerated and RW not allowed in jail. RW would be helpful for energy conservation during mobility. SpO2 925 at rest on 5L. Mobilized on 6L with desat to 79%. Seated rest break between gait trials to recover to 90%. Pt performed LE exercises in recliner. Multiple rest breaks during exercise due to SOB/desat to 83%. Pt in recliner with feet elevated at end of session.    Follow Up Recommendations  No PT follow up;Supervision for mobility/OOB     Equipment Recommendations  None recommended by PT    Recommendations for Other Services       Precautions / Restrictions Precautions Precautions: Fall;Other (comment) Precaution Comments: watch sats    Mobility  Bed Mobility               General bed mobility comments: received in recliner  Transfers Overall transfer level: Needs assistance Equipment used: None Transfers: Sit to/from Stand;Stand Pivot Transfers Sit to Stand: Min guard Stand pivot transfers: Min guard       General transfer comment: Min Guard A for safety, increased time  Ambulation/Gait Ambulation/Gait assistance: Min guard Gait Distance (Feet): 10 Feet (x 2) Assistive device: None Gait Pattern/deviations: Step-through pattern;Decreased stride length Gait velocity: Decreased   General Gait Details: SpO2 92% at rest on 5L. Amb on 6L with desat to 79%. Increased recovery time between trials to return to 90%.   Stairs              Wheelchair Mobility    Modified Rankin (Stroke Patients Only)       Balance Overall balance assessment: Needs assistance Sitting-balance support: No upper extremity supported;Feet supported Sitting balance-[name] Scale: Good     Standing balance support: During functional activity;No upper extremity supported Standing balance-[name] Scale: Fair                              Cognition Arousal/Alertness: Awake/alert Behavior During Therapy: WFL for tasks assessed/performed Overall Cognitive Status: Within Functional Limits for tasks assessed                                        Exercises General Exercises - Lower Extremity Ankle Circles/Pumps: AROM;Both;20 reps;Seated Quad Sets: AROM;Both;10 reps;Supine Gluteal Sets: AROM;Both;10 reps;Supine Long Arc Quad: AROM;Both;10 reps;Seated Hip ABduction/ADduction: AROM;Both;10 reps;Supine    General Comments General comments (skin integrity, edema, etc.): SpO2 92% at rest on 5L. Desat to 79% during mobility on 6L. Multipl seated rest breaks between gait trials and during exercises to recover to 90%.      Pertinent Vitals/Pain Pain Assessment: No/denies pain    Home Living                      Prior Function            PT Goals (current goals can now  be found in the care plan section) Acute Rehab PT Goals Patient Stated Goal: feel stronger Progress towards PT goals: Progressing toward goals    Frequency    Min 3X/week      PT Plan Equipment recommendations need to be updated    Co-evaluation              AM-PAC PT "6 Clicks" Mobility   Outcome Measure  Help needed turning from your back to your side while in a flat bed without using bedrails?: None Help needed moving from lying on your back to sitting on the side of a flat bed without using bedrails?: None Help needed moving to and from a bed to a chair (including a wheelchair)?: A Little Help needed standing up  from a chair using your arms (e.g., wheelchair or bedside chair)?: A Little Help needed to walk in hospital room?: A Little Help needed climbing 3-5 steps with a railing? : A Little 6 Click Score: 20    End of Session Equipment Utilized During Treatment: Oxygen;Gait belt Activity Tolerance: Patient tolerated treatment well Patient left: in chair;with call bell/phone within reach;Other (comment) (officer/guard in room) Nurse Communication: Mobility status PT Visit Diagnosis: Other abnormalities of gait and mobility (R26.89);Difficulty in walking, not elsewhere classified (R26.2)     Time: 9242-6834 PT Time Calculation (min) (ACUTE ONLY): 25 min  Charges:  $Gait Training: 8-22 mins $Therapeutic Exercise: 8-22 mins                     Lorrin Goodell, PT  Office # [phone] Pager [phone]    Howard Sawyer 09/28/2020, 10:25 AM

## 2020-09-28 NOTE — Discharge Instructions (Signed)
Person Under Monitoring Name: Howard Sawyer  Location: [place] [address]   Infection Prevention Recommendations for Individuals Confirmed to have, or Being Evaluated for, 2019 Novel Coronavirus (COVID-19) Infection Who Receive Care at Home  Individuals who are confirmed to have, or are being evaluated for, COVID-19 should follow the prevention steps below until a healthcare provider or local or state health department says they can return to normal activities.  Stay home except to get medical care You should restrict activities outside your home, except for getting medical care. Do not go to work, school, or public areas, and do not use public transportation or taxis.  Call ahead before visiting your doctor Before your medical appointment, call the healthcare provider and tell them that you have, or are being evaluated for, COVID-19 infection. This will help the healthcare provider's office take steps to keep other people from getting infected. Ask your healthcare provider to call the local or state health department.  Monitor your symptoms Seek prompt medical attention if your illness is worsening (e.g., difficulty breathing). Before going to your medical appointment, call the healthcare provider and tell them that you have, or are being evaluated for, COVID-19 infection. Ask your healthcare provider to call the local or state health department.  Wear a facemask You should wear a facemask that covers your nose and mouth when you are in the same room with other people and when you visit a healthcare provider. People who live with or visit you should also wear a facemask while they are in the same room with you.  Separate yourself from other people in your home As much as possible, you should stay in a different room from other people in your home. Also, you should use a separate bathroom, if available.  Avoid sharing household items You should not share  dishes, drinking glasses, cups, eating utensils, towels, bedding, or other items with other people in your home. After using these items, you should wash them thoroughly with soap and water.  Cover your coughs and sneezes Cover your mouth and nose with a tissue when you cough or sneeze, or you can cough or sneeze into your sleeve. Throw used tissues in a lined trash can, and immediately wash your hands with soap and water for at least 20 seconds or use an alcohol-based hand rub.  Wash your [facility] your hands often and thoroughly with soap and water for at least 20 seconds. You can use an alcohol-based hand sanitizer if soap and water are not available and if your hands are not visibly dirty. Avoid touching your eyes, nose, and mouth with unwashed hands.   Prevention Steps for Caregivers and Household Members of Individuals Confirmed to have, or Being Evaluated for, COVID-19 Infection Being Cared for in the Home  If you live with, or provide care at home for, a person confirmed to have, or being evaluated for, COVID-19 infection please follow these guidelines to prevent infection:  Follow healthcare provider's instructions Make sure that you understand and can help the patient follow any healthcare provider instructions for all care.  Provide for the patient's basic needs You should help the patient with basic needs in the home and provide support for getting groceries, prescriptions, and other personal needs.  Monitor the patient's symptoms If they are getting sicker, call his or her medical provider and tell them that the patient has, or is being evaluated for, COVID-19 infection. This will help the healthcare provider's office  take steps to keep other people from getting infected. Ask the healthcare provider to call the local or state health department.  Limit the number of people who have contact with the patient  If possible, have only one caregiver for the patient.  Other  household members should stay in another home or place of residence. If this is not possible, they should stay  in another room, or be separated from the patient as much as possible. Use a separate bathroom, if available.  Restrict visitors who do not have an essential need to be in the home.  Keep older adults, very young children, and other sick people away from the patient Keep older adults, very young children, and those who have compromised immune systems or chronic health conditions away from the patient. This includes people with chronic heart, lung, or kidney conditions, diabetes, and cancer.  Ensure good ventilation Make sure that shared spaces in the home have good air flow, such as from an air conditioner or an opened window, weather permitting.  Wash your hands often  Wash your hands often and thoroughly with soap and water for at least 20 seconds. You can use an alcohol based hand sanitizer if soap and water are not available and if your hands are not visibly dirty.  Avoid touching your eyes, nose, and mouth with unwashed hands.  Use disposable paper towels to dry your hands. If not available, use dedicated cloth towels and replace them when they become wet.  Wear a facemask and gloves  Wear a disposable facemask at all times in the room and gloves when you touch or have contact with the patient's blood, body fluids, and/or secretions or excretions, such as sweat, saliva, sputum, nasal mucus, vomit, urine, or feces.  Ensure the mask fits over your nose and mouth tightly, and do not touch it during use.  Throw out disposable facemasks and gloves after using them. Do not reuse.  Wash your hands immediately after removing your facemask and gloves.  If your personal clothing becomes contaminated, carefully remove clothing and launder. Wash your hands after handling contaminated clothing.  Place all used disposable facemasks, gloves, and other waste in a lined container before  disposing them with other household waste.  Remove gloves and wash your hands immediately after handling these items.  Do not share dishes, glasses, or other household items with the patient  Avoid sharing household items. You should not share dishes, drinking glasses, cups, eating utensils, towels, bedding, or other items with a patient who is confirmed to have, or being evaluated for, COVID-19 infection.  After the person uses these items, you should wash them thoroughly with soap and water.  Wash laundry thoroughly  Immediately remove and wash clothes or bedding that have blood, body fluids, and/or secretions or excretions, such as sweat, saliva, sputum, nasal mucus, vomit, urine, or feces, on them.  Wear gloves when handling laundry from the patient.  Read and follow directions on labels of laundry or clothing items and detergent. In general, wash and dry with the warmest temperatures recommended on the label.  Clean all areas the individual has used often  Clean all touchable surfaces, such as counters, tabletops, doorknobs, bathroom fixtures, toilets, phones, keyboards, tablets, and bedside tables, every day. Also, clean any surfaces that may have blood, body fluids, and/or secretions or excretions on them.  Wear gloves when cleaning surfaces the patient has come in contact with.  Use a diluted bleach solution (e.g., dilute bleach with 1 part  bleach and 10 parts water) or a household disinfectant with a label that says EPA-registered for coronaviruses. To make a bleach solution at home, add 1 tablespoon of bleach to 1 quart (4 cups) of water. For a larger supply, add  cup of bleach to 1 gallon (16 cups) of water.  Read labels of cleaning products and follow recommendations provided on product labels. Labels contain instructions for safe and effective use of the cleaning product including precautions you should take when applying the product, such as wearing gloves or eye protection  and making sure you have good ventilation during use of the product.  Remove gloves and wash hands immediately after cleaning.  Monitor yourself for signs and symptoms of illness Caregivers and household members are considered close contacts, should monitor their health, and will be asked to limit movement outside of the home to the extent possible. Follow the monitoring steps for close contacts listed on the symptom monitoring form.   ? If you have additional questions, contact your local health department or call the epidemiologist on call at [phone] (available 24/7). ? This guidance is subject to change. For the most up-to-date guidance from [facility], please refer to their website: [email or web address]

## 2020-09-28 NOTE — TOC Transition Note (Signed)
Transition of Care [facility]) - CM/SW Discharge Note   Patient Details  Name: Howard Sawyer MRN: [id number] Date of Birth: 1941/06/22  [facility] CM/SW Contact:  Pollie Friar, RN Phone Number: 09/28/2020, 1:19 PM   Clinical Narrative:    Pt is from [facility] and will be discharging to [facility] since he is going to require oxygen. Per deputies at the bedside they will have an oxygen tank brought to the hospital for transport. The deputies will then transport him to [facility]. Bedside RN updated.   Number for report for [facility] medical unit: [phone]  Final next level of care: Other (comment) [facility]) Barriers to Discharge: No Barriers Identified   Patient Goals and CMS Choice        Discharge Placement                       Discharge Plan and Services                                     Social Determinants of Health (SDOH) Interventions     Readmission Risk Interventions No flowsheet data found.

## 2020-09-28 NOTE — Progress Notes (Signed)
Called facility and gave report. No questions at this time.

## 2020-09-28 NOTE — Progress Notes (Signed)
Patient discharging to prison facility. Patient discharging with AVS. Called facility to give report and no one answered. Provided call back number to prison guards.

## 2020-09-28 NOTE — Discharge Summary (Signed)
Howard Sawyer, is a 79 y.o. male  DOB 04/26/1941  MRN [id number].  Admission date:  09/17/2020  Admitting Physician  Murlean Iba, MD  Discharge Date:  09/28/2020   Primary MD  Asencion Noble, MD  Recommendations for primary care physician for things to follow:  -Patient is being discharged on 4 L nasal cannula, wean as tolerated, please keep encouraging to use incentive spirometry and flutter valve. -Adjust insulin regimen as needed.  Admission Diagnosis  Acute respiratory failure with hypoxia (HCC) [J96.01] Pneumonia due to COVID-19 virus [U07.1, J12.82]   Discharge Diagnosis  Acute respiratory failure with hypoxia ([place]) [J96.01] Pneumonia due to COVID-19 virus [U07.1, J12.82]    Principal Problem:   Pneumonia due to COVID-19 virus Active Problems:   Essential hypertension   Hyponatremia   Transaminitis      Past Medical History:  Diagnosis Date  . Colon polyps    tubular adenoma  . Diverticulosis   . Hyperlipidemia   . Hypertension   . Prostate CA ([place]) 2010    Past Surgical History:  Procedure Laterality Date  . COLONOSCOPY  03/08/2007   Serrated adenoma rectal polyp   . COLONOSCOPY   12/02/2003    Left sided diverticula/ Pedunculated polyp left colon resected as described above  . COLONOSCOPY  02/16/2012   RMR:  Multiple rectal and colonic polyps-removed and/or ablated as described above.   . COLONOSCOPY N/A 03/16/2015   Dr.Rourk- multiple colonic polyps, colonic diverticulosis, redundant colon bx= tubular adenoma  . COLONOSCOPY N/A 09/07/2015   Procedure: COLONOSCOPY;  Surgeon: Daneil Dolin, MD;  Location: [facility];  Service: Endoscopy;  Laterality: N/A;  130   . PROSTATECTOMY         History of present illness and  Hospital Course:     Kindly see H&P for history of present illness and admission details, please review complete Labs, Consult reports and Test  reports for all details in brief  HPI  from the history and physical done on the day of admission ON 10/14/20121  Howard Sawyer  is a 79 y.o. male With history of prostate cancer, hypertension, hyperlipidemia, and diverticulosis presents to the ED with a chief complaint of weakness.  Patient reports that he has had generalized weakness, no appetite, shortness of breath, cough, clear sputum, and fever for 1 week.  Symptoms of getting progressively worse over 1 week.  He is not vaccinated for Covid.  He also reports an associated loss of taste, but no loss of smell.  Patient denies chest pains, body aches.  He did have associated diarrhea at the onset of symptoms.  Patient reports he has been taking Tylenol for fever and it has helped.  Patient does not smoke and has no known lung disease.  In the ED Temperature 98.5, heart rate 56, respiratory rate 16, blood pressure 104/86 Patient was saturating low 80s on room air, improved to mid 90s on 4 L nasal cannula CHEM panel reveals a hyponatremia of 128, hyperglycemia 153 hypoalbuminemia at 3.2,  AST is elevated at 228, ALT is elevated at 2 4 Hematology shows a leukopenia at 3.4 Inflammatory markers show an LDH of 441, triglycerides of 131, ferritin 3007, CRP 2.9, lactic acid 1.8, pro-Cal 0.14, fibrinogen 505, D-dimer 0.83 Chest x-ray shows bilateral airspace infiltrates likely infectious or inflammatory Covid positive Remdesivir Decadron started in the ED Admission requested for further management of respiratory failure and Covid pneumonia   Hospital Course   Acute hypoxic respiratory failure secondary to acute Covid-19 viral pneumonia during the ongoing 2020/2021 Covid 19 Pandemic  -Unfortunately patient is unvaccinated. -He does appear to be having severe COVID-19 pneumonia (present on admission). -Patient initially on 4 to 5 L oxygen requirement, at one point with significant oxygen requirement where he required up to 10 L high flow nasal  cannula, he was treated with IV steroids, he did finish 5 days of IV remdesivir, and he was started on baricitinib as well, his hypoxia oxygen requirement has significantly improved, since transition to oral prednisone, baricitinib can be discontinued on discharge, and he will be discharged another 3 days of oral prednisone for total of 14 days treatment . -Wean oxygen as tolerated . -Keep encouraging to use incentive spirometry and flutter valve . -continue scheduled duo nebs, and as needed albuterol .  Hyponatremia - resolved  Hyperglycemia Hemoglobin A1c 5.9.  Patient with some hyperglycemia during hospital stay, most likely in the setting of IV steroids, he was started on insulin sliding scale, premeal insulin and Lantus, as well he was started on Tradjenta, now a steroid has been being tapered, he is having some hypoglycemic episodes, so he will be discharged on insulin sliding scale with anticipation he will need no further treatment once he is off steroids .  Moderate Protein calorie malnutrition Body mass index is 24.33 kg/m. --nutrition following --Continue supplementation with Ensure 3 times daily, multivitamin  Transaminitis - Etiology likely secondary to Covid-19 viral infection.  Acute hepatitis panel negative. -Resolved  Essential hypertension --Losartan 100 mg p.o. daily --Aspirin 81 mg p.o. daily  Weakness/debility/deconditioning --PT/OT consulted    Discharge Condition: stable      Discharge Instructions  and  Discharge Medications    Discharge Instructions    Discharge instructions   Complete by: As directed    -Please keep encouraging to use incentive spirometer and flutter valve, wean oxygen as tolerated.   Increase activity slowly   Complete by: As directed      Allergies as of 09/28/2020      Reactions   Codeine Other (See Comments)   unk      Medication List    STOP taking these medications   amLODipine 5 MG tablet Commonly known as:  NORVASC   azithromycin 250 MG tablet Commonly known as: ZITHROMAX   bacitracin ointment   ibuprofen 600 MG tablet Commonly known as: ADVIL   polyethylene glycol-electrolytes 420 g solution Commonly known as: TriLyte     TAKE these medications   acetaminophen 325 MG tablet Commonly known as: TYLENOL Take 2 tablets (650 mg total) by mouth every 6 (six) hours as needed for mild pain or headache (fever >/= 101).   albuterol 108 (90 Base) MCG/ACT inhaler Commonly known as: VENTOLIN HFA Inhale 2 puffs into the lungs every 6 (six) hours as needed for wheezing or shortness of breath.   aspirin EC 81 MG tablet Take 81 mg by mouth daily.   DAYQUIL MULTI-SYMPTOM COLD/FLU PO Take 1 tablet by mouth daily as needed (cold flu symptoms).  docusate sodium 100 MG capsule Commonly known as: COLACE Take 100 mg by mouth daily.   feeding supplement Liqd Take 237 mLs by mouth 3 (three) times daily between meals.   guaiFENesin-dextromethorphan 100-10 MG/5ML syrup Commonly known as: ROBITUSSIN DM Take 10 mLs by mouth every 4 (four) hours as needed for cough.   insulin aspart 100 UNIT/ML injection Commonly known as: novoLOG Inject 0-5 Units into the skin at bedtime.   insulin aspart 100 UNIT/ML injection Commonly known as: novoLOG Inject 0-9 Units into the skin 3 (three) times daily with meals.   Ipratropium-Albuterol 20-100 MCG/ACT Aers respimat Commonly known as: COMBIVENT Inhale 1 puff into the lungs 2 (two) times daily.   losartan 100 MG tablet Commonly known as: COZAAR Take 100 mg by mouth daily.   pantoprazole 40 MG tablet Commonly known as: PROTONIX Take 1 tablet (40 mg total) by mouth daily. Start taking on: September 29, 2020   predniSONE 20 MG tablet Commonly known as: DELTASONE Take 1 tablet (20 mg total) by mouth daily with breakfast for 3 days. Start taking on: September 29, 2020         Diet and Activity recommendation: See Discharge Instructions  above   Consults obtained -  None   Major procedures and Radiology Reports - PLEASE review detailed and final reports for all details, in brief -     DG Chest Port 1 View  Result Date: 09/23/2020 CLINICAL DATA:  COVID-19. EXAM: PORTABLE CHEST 1 VIEW COMPARISON:  09/17/2020 FINDINGS: Similar versus slightly improved peripheral predominant interstitial opacities. No new focal consolidation. No visible pleural effusions or pneumothorax. Cardiomediastinal silhouette is similar to prior. No acute osseous abnormality. IMPRESSION: Similar versus slightly improved peripheral predominant interstitial opacities, compatible with reported COVID diagnosis. Electronically Signed   By: Margaretha Sheffield MD   On: 09/23/2020 15:31   DG Chest Portable 1 View  Result Date: 09/17/2020 CLINICAL DATA:  COVID pneumonia EXAM: PORTABLE CHEST 1 VIEW COMPARISON:  08/05/2017 FINDINGS: Lung volumes are small and pulmonary insufflation has diminished since prior examination. Pulmonary insufflation is symmetric. Extensive bilateral mid and lower lung zone airspace infiltrates are identified, likely infectious or inflammatory in nature in typical of those seen in COVID-19 pneumonia. No pneumothorax or pleural effusion. Cardiac size within normal limits. Pulmonary vascularity is normal. No acute bone abnormality. IMPRESSION: Extensive bilateral airspace infiltrates, likely infectious or inflammatory in nature. Electronically Signed   By: Fidela Salisbury MD   On: 09/17/2020 00:49    Micro Results    No results found for this or any previous visit (from the past 240 hour(s)).     Today   Subjective:   Howard Sawyer today has no headache,no chest abdominal pain,no new weakness tingling or numbness, feels much better  today.   Objective:   Blood pressure (!) 100/58, pulse 69, temperature 97.8 F (36.6 C), temperature source Oral, resp. rate 20, height 5\' 8"  (1.727 m), weight 72.6 kg, SpO2 93 %.   Intake/Output  Summary (Last 24 hours) at 09/28/2020 1251 Last data filed at 09/28/2020 1053 Gross per 24 hour  Intake 480 ml  Output 500 ml  Net -20 ml    Exam Awake Alert, Oriented x 3, frail ,No new F.N deficits, Normal affect Symmetrical Chest wall movement, Good air movement bilaterally, CTAB RRR,No Gallops,Rubs or new Murmurs, No Parasternal Heave +ve B.Sounds, Abd Soft, Non tender, No organomegaly appriciated, No rebound -guarding or rigidity. No Cyanosis, Clubbing or edema, No new Rash or bruise  Data  Review   CBC w Diff:  Lab Results  Component Value Date   WBC 11.0 (H) 09/28/2020   HGB 12.7 (L) 09/28/2020   HCT 38.9 (L) 09/28/2020   PLT 257 09/28/2020   LYMPHOPCT 4 09/22/2020   MONOPCT 4 09/22/2020   EOSPCT 4 09/22/2020   BASOPCT 0 09/22/2020    CMP:  Lab Results  Component Value Date   NA 139 09/28/2020   K 4.1 09/28/2020   CL 102 09/28/2020   CO2 31 09/28/2020   BUN 32 (H) 09/28/2020   CREATININE 1.19 09/28/2020   PROT 4.7 (L) 09/28/2020   ALBUMIN 2.2 (L) 09/28/2020   BILITOT 0.7 09/28/2020   ALKPHOS 48 09/28/2020   AST 17 09/28/2020   ALT 29 09/28/2020  .   Total Time in preparing paper work, data evaluation and todays exam - 30 minutes  Phillips Climes M.D on 09/28/2020 at 12:51 PM  [facility]   Office  [phone]

## 2022-06-15 IMAGING — DX DG CHEST 1V PORT
1 series · 1 of 1 positions shown · non-contrast
Comparison: 09/17/2020

CLINICAL DATA: [id number].

EXAM:
PORTABLE CHEST 1 VIEW

[chest]
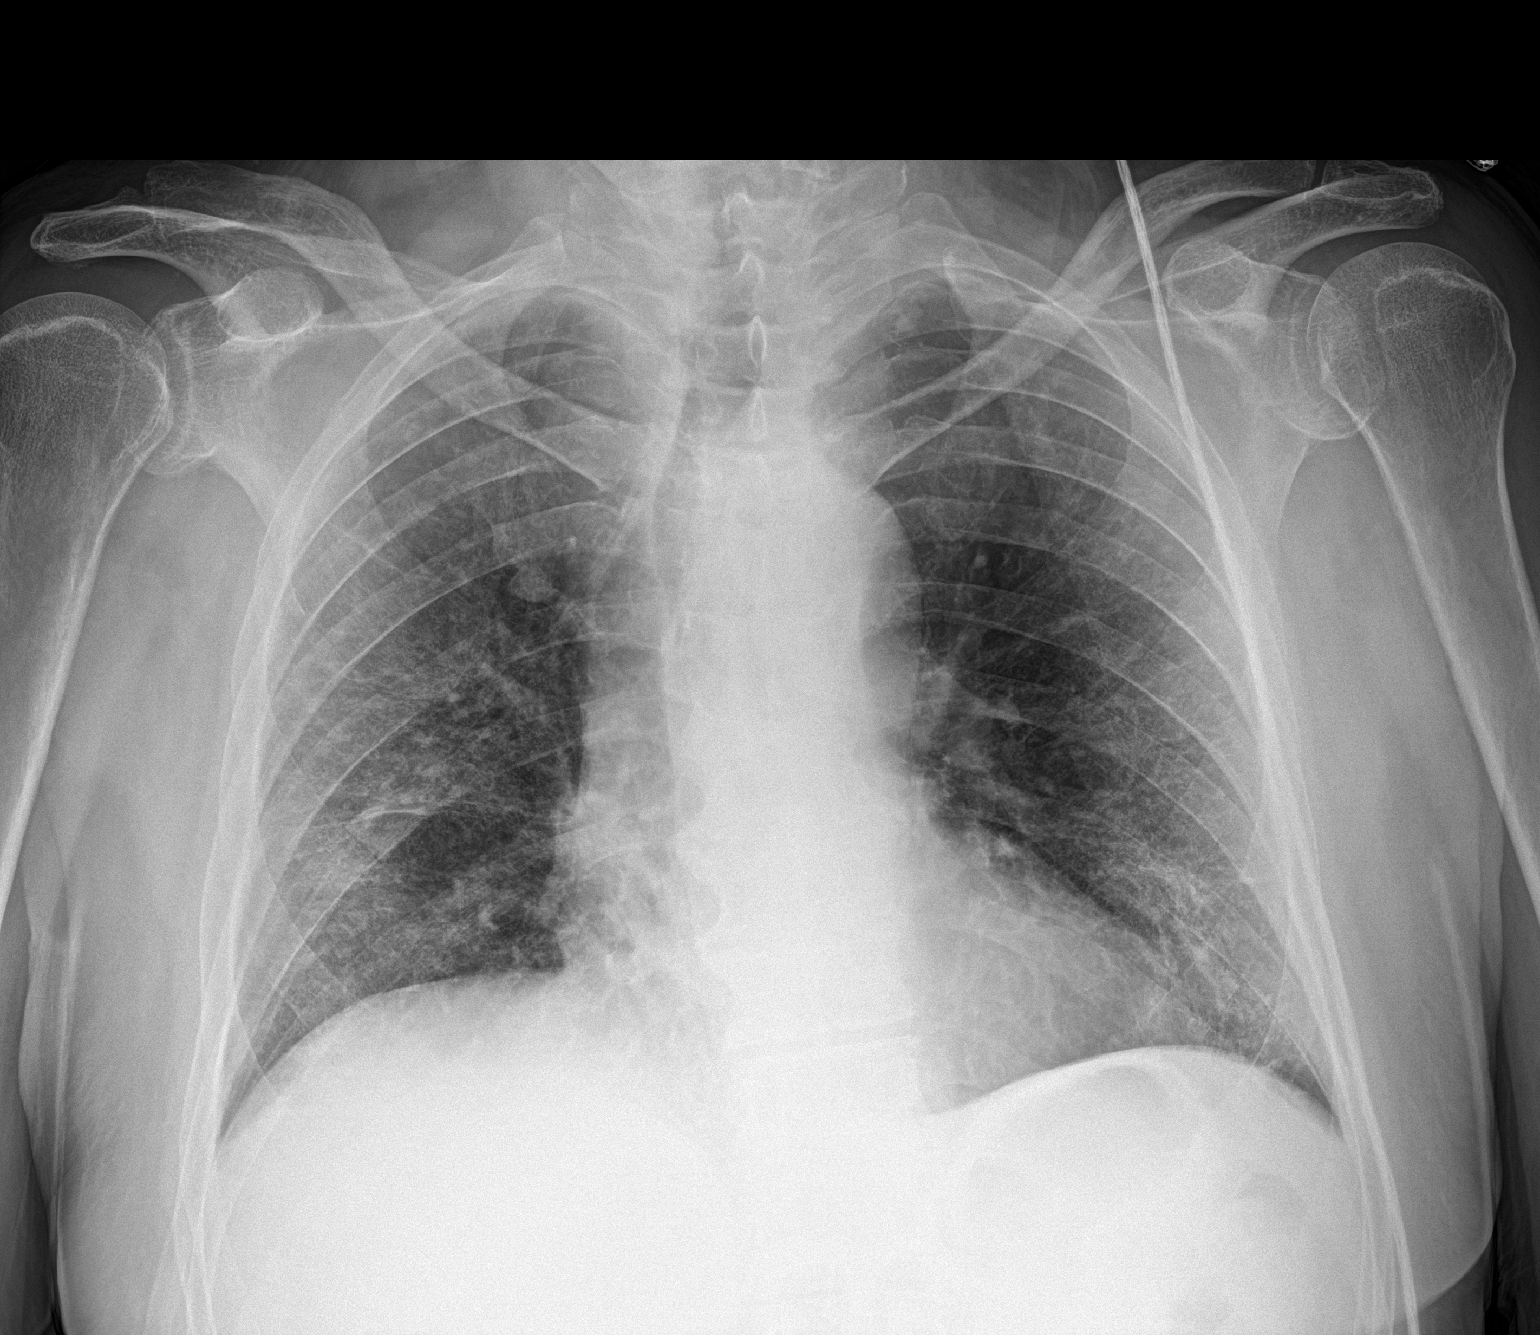

[1 of 1 positions shown; findings below may reference images not displayed]

FINDINGS: Similar versus slightly improved peripheral predominant interstitial
opacities. No new focal consolidation. No visible pleural effusions
or pneumothorax. Cardiomediastinal silhouette is similar to prior.
No acute osseous abnormality.
IMPRESSION: Similar versus slightly improved peripheral predominant interstitial
opacities, compatible with reported COVID diagnosis.
